# Patient Record
Sex: Male | Born: 1960 | Race: White | Hispanic: No | Smoking: Never smoker
Health system: Southern US, Community
[De-identification: ages and names within clinical notes are randomized; demographics above are authoritative.]

## PROBLEM LIST (undated history)

## (undated) DIAGNOSIS — S42009A Fracture of unspecified part of unspecified clavicle, initial encounter for closed fracture: Secondary | ICD-10-CM

## (undated) DIAGNOSIS — S2239XA Fracture of one rib, unspecified side, initial encounter for closed fracture: Secondary | ICD-10-CM

## (undated) DIAGNOSIS — R0603 Acute respiratory distress: Secondary | ICD-10-CM

## (undated) DIAGNOSIS — J811 Chronic pulmonary edema: Secondary | ICD-10-CM

## (undated) DIAGNOSIS — T7840XA Allergy, unspecified, initial encounter: Secondary | ICD-10-CM

## (undated) DIAGNOSIS — M1990 Unspecified osteoarthritis, unspecified site: Secondary | ICD-10-CM

## (undated) DIAGNOSIS — S2500XA Unspecified injury of thoracic aorta, initial encounter: Secondary | ICD-10-CM

## (undated) DIAGNOSIS — J90 Pleural effusion, not elsewhere classified: Secondary | ICD-10-CM

## (undated) HISTORY — PX: COLONOSCOPY: SHX174

## (undated) HISTORY — DX: Allergy, unspecified, initial encounter: T78.40XA

## (undated) HISTORY — PX: FRACTURE SURGERY: SHX138

## (undated) HISTORY — DX: Unspecified osteoarthritis, unspecified site: M19.90

---
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
## 2015-10-30 ENCOUNTER — Emergency Department (HOSPITAL_COMMUNITY)
Admission: EM | Admit: 2015-10-30 | Discharge: 2015-10-30 | Disposition: A | Discharge status: Home / Self Care | Payer: BC Managed Care – PPO | Attending: Emergency Medicine | Admitting: Emergency Medicine

## 2015-10-30 ENCOUNTER — Encounter (HOSPITAL_COMMUNITY): Payer: Self-pay | Admitting: *Deleted

## 2015-10-30 DIAGNOSIS — Y998 Other external cause status: Secondary | ICD-10-CM | POA: Diagnosis not present

## 2015-10-30 DIAGNOSIS — T7840XA Allergy, unspecified, initial encounter: Secondary | ICD-10-CM | POA: Diagnosis not present

## 2015-10-30 DIAGNOSIS — Y9389 Activity, other specified: Secondary | ICD-10-CM | POA: Insufficient documentation

## 2015-10-30 DIAGNOSIS — X58XXXA Exposure to other specified factors, initial encounter: Secondary | ICD-10-CM | POA: Diagnosis not present

## 2015-10-30 DIAGNOSIS — Y9289 Other specified places as the place of occurrence of the external cause: Secondary | ICD-10-CM | POA: Diagnosis not present

## 2015-10-30 NOTE — ED Notes (Signed)
See note

## 2015-10-30 NOTE — ED Notes (Signed)
The pt is c/o a swollen upper lip since he ate spicy chicken sandwich 1900  No diff breathing  He took a benadryl 25 mg approx one hour ago. He took  benadryl one hour ago

## 2015-10-30 NOTE — ED Notes (Signed)
The pt still has swelling in his upper lip  It is not getting any more swollen  And he has no breathing difficulty.

## 2015-10-30 NOTE — ED Notes (Signed)
The pts lip  Is sl smaller  And he feels like he is not having any more problems  He will leave  Instructed to take another benadryl when he gets home  Sleep with huis head elevated  And returen if it gets worse or difficulty breathing   Then he needs to come back

## 2019-02-17 ENCOUNTER — Emergency Department (HOSPITAL_COMMUNITY): Payer: BC Managed Care – PPO

## 2019-02-17 ENCOUNTER — Encounter (HOSPITAL_COMMUNITY): Payer: Self-pay | Admitting: Emergency Medicine

## 2019-02-17 ENCOUNTER — Other Ambulatory Visit: Payer: Self-pay

## 2019-02-17 ENCOUNTER — Inpatient Hospital Stay (HOSPITAL_COMMUNITY)
Admission: EM | Admit: 2019-02-17 | Discharge: 2019-02-25 | DRG: 907 | Disposition: A | Discharge status: Home Health | Payer: BC Managed Care – PPO | Attending: General Surgery | Admitting: General Surgery

## 2019-02-17 DIAGNOSIS — N179 Acute kidney failure, unspecified: Secondary | ICD-10-CM | POA: Diagnosis present

## 2019-02-17 DIAGNOSIS — F419 Anxiety disorder, unspecified: Secondary | ICD-10-CM | POA: Diagnosis present

## 2019-02-17 DIAGNOSIS — S2243XA Multiple fractures of ribs, bilateral, initial encounter for closed fracture: Secondary | ICD-10-CM | POA: Diagnosis present

## 2019-02-17 DIAGNOSIS — S42001A Fracture of unspecified part of right clavicle, initial encounter for closed fracture: Secondary | ICD-10-CM | POA: Diagnosis present

## 2019-02-17 DIAGNOSIS — J811 Chronic pulmonary edema: Secondary | ICD-10-CM

## 2019-02-17 DIAGNOSIS — M549 Dorsalgia, unspecified: Secondary | ICD-10-CM | POA: Diagnosis present

## 2019-02-17 DIAGNOSIS — R609 Edema, unspecified: Secondary | ICD-10-CM | POA: Diagnosis present

## 2019-02-17 DIAGNOSIS — Y906 Blood alcohol level of 120-199 mg/100 ml: Secondary | ICD-10-CM | POA: Diagnosis present

## 2019-02-17 DIAGNOSIS — S2502XA Major laceration of thoracic aorta, initial encounter: Secondary | ICD-10-CM | POA: Diagnosis not present

## 2019-02-17 DIAGNOSIS — E876 Hypokalemia: Secondary | ICD-10-CM | POA: Diagnosis present

## 2019-02-17 DIAGNOSIS — I10 Essential (primary) hypertension: Secondary | ICD-10-CM | POA: Diagnosis present

## 2019-02-17 DIAGNOSIS — R0603 Acute respiratory distress: Secondary | ICD-10-CM

## 2019-02-17 DIAGNOSIS — D62 Acute posthemorrhagic anemia: Secondary | ICD-10-CM | POA: Diagnosis present

## 2019-02-17 DIAGNOSIS — J9690 Respiratory failure, unspecified, unspecified whether with hypoxia or hypercapnia: Secondary | ICD-10-CM

## 2019-02-17 DIAGNOSIS — K567 Ileus, unspecified: Secondary | ICD-10-CM | POA: Diagnosis present

## 2019-02-17 DIAGNOSIS — S2500XA Unspecified injury of thoracic aorta, initial encounter: Secondary | ICD-10-CM | POA: Diagnosis not present

## 2019-02-17 DIAGNOSIS — J939 Pneumothorax, unspecified: Secondary | ICD-10-CM

## 2019-02-17 DIAGNOSIS — S2249XA Multiple fractures of ribs, unspecified side, initial encounter for closed fracture: Secondary | ICD-10-CM

## 2019-02-17 DIAGNOSIS — R000 Tachycardia, unspecified: Secondary | ICD-10-CM | POA: Diagnosis present

## 2019-02-17 DIAGNOSIS — S2509XA Other specified injury of thoracic aorta, initial encounter: Secondary | ICD-10-CM

## 2019-02-17 DIAGNOSIS — S42009A Fracture of unspecified part of unspecified clavicle, initial encounter for closed fracture: Secondary | ICD-10-CM

## 2019-02-17 DIAGNOSIS — D72829 Elevated white blood cell count, unspecified: Secondary | ICD-10-CM | POA: Diagnosis present

## 2019-02-17 DIAGNOSIS — J9601 Acute respiratory failure with hypoxia: Secondary | ICD-10-CM | POA: Diagnosis present

## 2019-02-17 DIAGNOSIS — S270XXA Traumatic pneumothorax, initial encounter: Secondary | ICD-10-CM | POA: Diagnosis present

## 2019-02-17 DIAGNOSIS — Z1159 Encounter for screening for other viral diseases: Secondary | ICD-10-CM

## 2019-02-17 DIAGNOSIS — F10229 Alcohol dependence with intoxication, unspecified: Secondary | ICD-10-CM | POA: Diagnosis present

## 2019-02-17 LAB — CBC
HCT: 40.8 % (ref 39.0–52.0)
Hemoglobin: 14.2 g/dL (ref 13.0–17.0)
MCH: 33.6 pg (ref 26.0–34.0)
MCHC: 34.8 g/dL (ref 30.0–36.0)
MCV: 96.5 fL (ref 80.0–100.0)
Platelets: 255 10*3/uL (ref 150–400)
RBC: 4.23 MIL/uL (ref 4.22–5.81)
RDW: 12.7 % (ref 11.5–15.5)
WBC: 6.3 10*3/uL (ref 4.0–10.5)
nRBC: 0 % (ref 0.0–0.2)

## 2019-02-17 LAB — COMPREHENSIVE METABOLIC PANEL
ALT: 152 U/L — ABNORMAL HIGH (ref 0–44)
AST: 157 U/L — ABNORMAL HIGH (ref 15–41)
Albumin: 3.9 g/dL (ref 3.5–5.0)
Alkaline Phosphatase: 59 U/L (ref 38–126)
Anion gap: 13 (ref 5–15)
BUN: 8 mg/dL (ref 6–20)
CO2: 21 mmol/L — ABNORMAL LOW (ref 22–32)
Calcium: 8.6 mg/dL — ABNORMAL LOW (ref 8.9–10.3)
Chloride: 104 mmol/L (ref 98–111)
Creatinine, Ser: 1.16 mg/dL (ref 0.61–1.24)
GFR calc Af Amer: >60 mL/min (ref >60)
GFR calc non Af Amer: >60 mL/min (ref >60)
Glucose, Bld: 148 mg/dL — ABNORMAL HIGH (ref 70–99)
Potassium: 3.2 mmol/L — ABNORMAL LOW (ref 3.5–5.1)
Sodium: 138 mmol/L (ref 135–145)
Total Bilirubin: 0.8 mg/dL (ref 0.3–1.2)
Total Protein: 6.8 g/dL (ref 6.5–8.1)

## 2019-02-17 LAB — I-STAT CHEM 8, ED
BUN: 9 mg/dL (ref 6–20)
Calcium, Ion: 1.01 mmol/L — ABNORMAL LOW (ref 1.15–1.40)
Chloride: 103 mmol/L (ref 98–111)
Creatinine, Ser: 1.4 mg/dL — ABNORMAL HIGH (ref 0.61–1.24)
Glucose, Bld: 145 mg/dL — ABNORMAL HIGH (ref 70–99)
HCT: 41 % (ref 39.0–52.0)
Hemoglobin: 13.9 g/dL (ref 13.0–17.0)
Potassium: 3.2 mmol/L — ABNORMAL LOW (ref 3.5–5.1)
Sodium: 138 mmol/L (ref 135–145)
TCO2: 19 mmol/L — ABNORMAL LOW (ref 22–32)

## 2019-02-17 LAB — PROTIME-INR
INR: 1.1 (ref 0.8–1.2)
Prothrombin Time: 13.9 seconds (ref 11.4–15.2)

## 2019-02-17 LAB — CK TOTAL AND CKMB (NOT AT ARMC)
CK, MB: 3.6 ng/mL (ref 0.5–5.0)
Relative Index: 1.8 (ref 0.0–2.5)
Total CK: 204 U/L (ref 49–397)

## 2019-02-17 LAB — SAMPLE TO BLOOD BANK

## 2019-02-17 LAB — LACTIC ACID, PLASMA: Lactic Acid, Venous: 4.7 mmol/L (ref 0.5–1.9)

## 2019-02-17 LAB — ETHANOL: Alcohol, Ethyl (B): 183 mg/dL — ABNORMAL HIGH (ref <10)

## 2019-02-17 MED ORDER — SODIUM CHLORIDE 0.9 % IV SOLN
INTRAVENOUS | Status: DC
  Administered 2019-02-18: via INTRAVENOUS

## 2019-02-17 MED ORDER — IOHEXOL 300 MG/ML  SOLN
100.0000 mL | Freq: Once | INTRAMUSCULAR | Status: AC | PRN
  Administered 2019-02-17: 100 mL via INTRAVENOUS

## 2019-02-17 MED ORDER — ONDANSETRON HCL 4 MG/2ML IJ SOLN
4.0000 mg | Freq: Once | INTRAMUSCULAR | Status: AC
  Administered 2019-02-17: 4 mg via INTRAVENOUS
  Filled 2019-02-17: qty 2

## 2019-02-17 MED ORDER — ESMOLOL HCL-SODIUM CHLORIDE 2000 MG/100ML IV SOLN
25.0000 ug/kg/min | INTRAVENOUS | Status: DC
  Administered 2019-02-17: 25 ug/kg/min via INTRAVENOUS
  Filled 2019-02-17: qty 100

## 2019-02-17 MED ORDER — SODIUM CHLORIDE 0.9 % IV BOLUS
1000.0000 mL | Freq: Once | INTRAVENOUS | Status: AC
  Administered 2019-02-17: 1000 mL via INTRAVENOUS

## 2019-02-17 MED ORDER — FENTANYL CITRATE (PF) 100 MCG/2ML IJ SOLN
50.0000 ug | Freq: Once | INTRAMUSCULAR | Status: AC
  Administered 2019-02-17: 50 ug via INTRAVENOUS
  Filled 2019-02-17: qty 2

## 2019-02-17 MED ORDER — FENTANYL CITRATE (PF) 100 MCG/2ML IJ SOLN
50.0000 ug | Freq: Once | INTRAMUSCULAR | Status: AC
  Administered 2019-02-17: 23:00:00 50 ug via INTRAVENOUS
  Filled 2019-02-17: qty 2
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
## 2019-02-17 NOTE — ED Notes (Signed)
Family updated as to patient's status.Girlfriend Marcelino Duster in consult room.

## 2019-02-17 NOTE — ED Notes (Signed)
Pt continues to cross arms on abdomen and hold tightly, pt reminded repeatedly to keep arms at his sides.
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
## 2019-02-17 NOTE — ED Notes (Signed)
Pt remedicated for pain, [facility] at bedside for legal blood draw
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
## 2019-02-17 NOTE — ED Notes (Signed)
Dr. Dwain Sarna at bedside, ccollar removed

## 2019-02-17 NOTE — ED Notes (Signed)
Ok per pt to give wallet with 22.00, pocket knife, keys and ring along with clothing and boots to his girlfriend.

## 2019-02-17 NOTE — ED Notes (Signed)
Pt returned from CT on CCM with RN. Pt continues to scream out in pain, c/o "spasms" between shoulder blades.

## 2019-02-17 NOTE — ED Notes (Signed)
Advised to hold esmolol to see if Fentanyl helped HR and BP come down.

## 2019-02-17 NOTE — ED Notes (Signed)
Dr. Criss Alvine to speak to family in consult.

## 2019-02-17 NOTE — ED Notes (Signed)
[facility] at bedside 

## 2019-02-18 ENCOUNTER — Inpatient Hospital Stay (HOSPITAL_COMMUNITY): Payer: BC Managed Care – PPO | Admitting: Anesthesiology

## 2019-02-18 ENCOUNTER — Other Ambulatory Visit (HOSPITAL_COMMUNITY): Payer: Self-pay | Admitting: *Deleted

## 2019-02-18 ENCOUNTER — Inpatient Hospital Stay (HOSPITAL_COMMUNITY): Payer: BC Managed Care – PPO

## 2019-02-18 ENCOUNTER — Encounter (HOSPITAL_COMMUNITY): Admission: EM | Disposition: A | Discharge status: Home Health | Payer: Self-pay | Source: Home / Self Care

## 2019-02-18 ENCOUNTER — Encounter (HOSPITAL_COMMUNITY): Payer: Self-pay | Admitting: Anesthesiology

## 2019-02-18 DIAGNOSIS — S2243XA Multiple fractures of ribs, bilateral, initial encounter for closed fracture: Secondary | ICD-10-CM | POA: Diagnosis present

## 2019-02-18 DIAGNOSIS — Z1159 Encounter for screening for other viral diseases: Secondary | ICD-10-CM | POA: Diagnosis not present

## 2019-02-18 DIAGNOSIS — S2500XA Unspecified injury of thoracic aorta, initial encounter: Secondary | ICD-10-CM | POA: Diagnosis present

## 2019-02-18 DIAGNOSIS — F419 Anxiety disorder, unspecified: Secondary | ICD-10-CM | POA: Diagnosis present

## 2019-02-18 DIAGNOSIS — J9601 Acute respiratory failure with hypoxia: Secondary | ICD-10-CM | POA: Diagnosis present

## 2019-02-18 DIAGNOSIS — D62 Acute posthemorrhagic anemia: Secondary | ICD-10-CM | POA: Diagnosis present

## 2019-02-18 DIAGNOSIS — S42001A Fracture of unspecified part of right clavicle, initial encounter for closed fracture: Secondary | ICD-10-CM | POA: Diagnosis present

## 2019-02-18 DIAGNOSIS — D72829 Elevated white blood cell count, unspecified: Secondary | ICD-10-CM | POA: Diagnosis present

## 2019-02-18 DIAGNOSIS — S2509XA Other specified injury of thoracic aorta, initial encounter: Secondary | ICD-10-CM

## 2019-02-18 DIAGNOSIS — S42009A Fracture of unspecified part of unspecified clavicle, initial encounter for closed fracture: Secondary | ICD-10-CM

## 2019-02-18 DIAGNOSIS — R0603 Acute respiratory distress: Secondary | ICD-10-CM

## 2019-02-18 DIAGNOSIS — I10 Essential (primary) hypertension: Secondary | ICD-10-CM | POA: Diagnosis present

## 2019-02-18 DIAGNOSIS — Y906 Blood alcohol level of 120-199 mg/100 ml: Secondary | ICD-10-CM | POA: Diagnosis present

## 2019-02-18 DIAGNOSIS — M549 Dorsalgia, unspecified: Secondary | ICD-10-CM | POA: Diagnosis present

## 2019-02-18 DIAGNOSIS — N179 Acute kidney failure, unspecified: Secondary | ICD-10-CM | POA: Diagnosis present

## 2019-02-18 DIAGNOSIS — K567 Ileus, unspecified: Secondary | ICD-10-CM | POA: Diagnosis present

## 2019-02-18 DIAGNOSIS — J811 Chronic pulmonary edema: Secondary | ICD-10-CM

## 2019-02-18 DIAGNOSIS — S270XXA Traumatic pneumothorax, initial encounter: Secondary | ICD-10-CM | POA: Diagnosis present

## 2019-02-18 DIAGNOSIS — E876 Hypokalemia: Secondary | ICD-10-CM | POA: Diagnosis present

## 2019-02-18 DIAGNOSIS — J90 Pleural effusion, not elsewhere classified: Secondary | ICD-10-CM

## 2019-02-18 DIAGNOSIS — F10229 Alcohol dependence with intoxication, unspecified: Secondary | ICD-10-CM | POA: Diagnosis present

## 2019-02-18 DIAGNOSIS — S2502XA Major laceration of thoracic aorta, initial encounter: Secondary | ICD-10-CM | POA: Diagnosis present

## 2019-02-18 DIAGNOSIS — S2249XA Multiple fractures of ribs, unspecified side, initial encounter for closed fracture: Secondary | ICD-10-CM

## 2019-02-18 DIAGNOSIS — R000 Tachycardia, unspecified: Secondary | ICD-10-CM | POA: Diagnosis present

## 2019-02-18 DIAGNOSIS — R609 Edema, unspecified: Secondary | ICD-10-CM | POA: Diagnosis present

## 2019-02-18 HISTORY — PX: THORACIC AORTIC ENDOVASCULAR STENT GRAFT: SHX6112

## 2019-02-18 HISTORY — DX: Acute respiratory distress: R06.03

## 2019-02-18 HISTORY — DX: Fracture of unspecified part of unspecified clavicle, initial encounter for closed fracture: S42.009A

## 2019-02-18 HISTORY — DX: Rider (driver) (passenger) of other motorcycle injured in unspecified traffic accident, initial encounter: V29.99XA

## 2019-02-18 HISTORY — DX: Pleural effusion, not elsewhere classified: J90

## 2019-02-18 HISTORY — DX: Multiple fractures of ribs, unspecified side, initial encounter for closed fracture: S22.49XA

## 2019-02-18 HISTORY — DX: Chronic pulmonary edema: J81.1

## 2019-02-18 HISTORY — DX: Unspecified injury of thoracic aorta, initial encounter: S25.00XA

## 2019-02-18 LAB — POCT I-STAT 7, (LYTES, BLD GAS, ICA,H+H)
Acid-base deficit: 9 mmol/L — ABNORMAL HIGH (ref 0.0–2.0)
Acid-base deficit: 13 mmol/L — ABNORMAL HIGH (ref 0.0–2.0)
Acid-base deficit: 5 mmol/L — ABNORMAL HIGH (ref 0.0–2.0)
Bicarbonate: 19.3 mmol/L — ABNORMAL LOW (ref 20.0–28.0)
Bicarbonate: 14 mmol/L — ABNORMAL LOW (ref 20.0–28.0)
Bicarbonate: 20.2 mmol/L (ref 20.0–28.0)
Calcium, Ion: 1.11 mmol/L — ABNORMAL LOW (ref 1.15–1.40)
Calcium, Ion: 1.02 mmol/L — ABNORMAL LOW (ref 1.15–1.40)
Calcium, Ion: 1.06 mmol/L — ABNORMAL LOW (ref 1.15–1.40)
HCT: 31 % — ABNORMAL LOW (ref 39.0–52.0)
HCT: 25 % — ABNORMAL LOW (ref 39.0–52.0)
HCT: 30 % — ABNORMAL LOW (ref 39.0–52.0)
Hemoglobin: 10.5 g/dL — ABNORMAL LOW (ref 13.0–17.0)
Hemoglobin: 8.5 g/dL — ABNORMAL LOW (ref 13.0–17.0)
Hemoglobin: 10.2 g/dL — ABNORMAL LOW (ref 13.0–17.0)
O2 Saturation: 98 %
O2 Saturation: 100 %
O2 Saturation: 99 %
Patient temperature: 97.5
Potassium: 4 mmol/L (ref 3.5–5.1)
Potassium: 2.9 mmol/L — ABNORMAL LOW (ref 3.5–5.1)
Potassium: 3.9 mmol/L (ref 3.5–5.1)
Sodium: 139 mmol/L (ref 135–145)
Sodium: 143 mmol/L (ref 135–145)
Sodium: 140 mmol/L (ref 135–145)
TCO2: 21 mmol/L — ABNORMAL LOW (ref 22–32)
TCO2: 15 mmol/L — ABNORMAL LOW (ref 22–32)
TCO2: 21 mmol/L — ABNORMAL LOW (ref 22–32)
pCO2 arterial: 49.6 mmHg — ABNORMAL HIGH (ref 32.0–48.0)
pCO2 arterial: 36.2 mmHg (ref 32.0–48.0)
pCO2 arterial: 36.9 mmHg (ref 32.0–48.0)
pH, Arterial: 7.197 — CL (ref 7.350–7.450)
pH, Arterial: 7.191 — CL (ref 7.350–7.450)
pH, Arterial: 7.345 — ABNORMAL LOW (ref 7.350–7.450)
pO2, Arterial: 120 mmHg — ABNORMAL HIGH (ref 83.0–108.0)
pO2, Arterial: 278 mmHg — ABNORMAL HIGH (ref 83.0–108.0)
pO2, Arterial: 131 mmHg — ABNORMAL HIGH (ref 83.0–108.0)

## 2019-02-18 LAB — BASIC METABOLIC PANEL
Anion gap: 12 (ref 5–15)
Anion gap: 13 (ref 5–15)
BUN: 8 mg/dL (ref 6–20)
BUN: 9 mg/dL (ref 6–20)
CO2: 17 mmol/L — ABNORMAL LOW (ref 22–32)
CO2: 20 mmol/L — ABNORMAL LOW (ref 22–32)
Calcium: 7.3 mg/dL — ABNORMAL LOW (ref 8.9–10.3)
Calcium: 7.8 mg/dL — ABNORMAL LOW (ref 8.9–10.3)
Chloride: 109 mmol/L (ref 98–111)
Chloride: 103 mmol/L (ref 98–111)
Creatinine, Ser: 0.97 mg/dL (ref 0.61–1.24)
Creatinine, Ser: 0.97 mg/dL (ref 0.61–1.24)
GFR calc Af Amer: >60 mL/min (ref >60)
GFR calc Af Amer: >60 mL/min (ref >60)
GFR calc non Af Amer: >60 mL/min (ref >60)
GFR calc non Af Amer: >60 mL/min (ref >60)
Glucose, Bld: 136 mg/dL — ABNORMAL HIGH (ref 70–99)
Glucose, Bld: 134 mg/dL — ABNORMAL HIGH (ref 70–99)
Potassium: 4 mmol/L (ref 3.5–5.1)
Potassium: 3.9 mmol/L (ref 3.5–5.1)
Sodium: 138 mmol/L (ref 135–145)
Sodium: 136 mmol/L (ref 135–145)

## 2019-02-18 LAB — POCT ACTIVATED CLOTTING TIME: Activated Clotting Time: 208 s

## 2019-02-18 LAB — POCT I-STAT, CHEM 8
BUN: 7 mg/dL (ref 6–20)
Calcium, Ion: 1.04 mmol/L — ABNORMAL LOW (ref 1.15–1.40)
Chloride: 109 mmol/L (ref 98–111)
Creatinine, Ser: 0.8 mg/dL (ref 0.61–1.24)
Glucose, Bld: 127 mg/dL — ABNORMAL HIGH (ref 70–99)
HCT: 29 % — ABNORMAL LOW (ref 39.0–52.0)
Hemoglobin: 9.9 g/dL — ABNORMAL LOW (ref 13.0–17.0)
Potassium: 3.6 mmol/L (ref 3.5–5.1)
Sodium: 142 mmol/L (ref 135–145)
TCO2: 19 mmol/L — ABNORMAL LOW (ref 22–32)

## 2019-02-18 LAB — URINALYSIS, ROUTINE W REFLEX MICROSCOPIC
Bilirubin Urine: NEGATIVE
Glucose, UA: NEGATIVE mg/dL
Ketones, ur: 5 mg/dL — AB
Leukocytes,Ua: NEGATIVE
Nitrite: NEGATIVE
Protein, ur: NEGATIVE mg/dL
Specific Gravity, Urine: >1.046 — ABNORMAL HIGH (ref 1.005–1.030)
pH: 5 (ref 5.0–8.0)

## 2019-02-18 LAB — CBC
HCT: 32.2 % — ABNORMAL LOW (ref 39.0–52.0)
HCT: 31.7 % — ABNORMAL LOW (ref 39.0–52.0)
HCT: 29.8 % — ABNORMAL LOW (ref 39.0–52.0)
Hemoglobin: 11.3 g/dL — ABNORMAL LOW (ref 13.0–17.0)
Hemoglobin: 11.1 g/dL — ABNORMAL LOW (ref 13.0–17.0)
Hemoglobin: 10.5 g/dL — ABNORMAL LOW (ref 13.0–17.0)
MCH: 34.1 pg — ABNORMAL HIGH (ref 26.0–34.0)
MCH: 33.8 pg (ref 26.0–34.0)
MCH: 33.4 pg (ref 26.0–34.0)
MCHC: 35.1 g/dL (ref 30.0–36.0)
MCHC: 35 g/dL (ref 30.0–36.0)
MCHC: 35.2 g/dL (ref 30.0–36.0)
MCV: 97.3 fL (ref 80.0–100.0)
MCV: 96.6 fL (ref 80.0–100.0)
MCV: 94.9 fL (ref 80.0–100.0)
Platelets: 197 10*3/uL (ref 150–400)
Platelets: 181 10*3/uL (ref 150–400)
Platelets: 178 10*3/uL (ref 150–400)
RBC: 3.31 MIL/uL — ABNORMAL LOW (ref 4.22–5.81)
RBC: 3.28 MIL/uL — ABNORMAL LOW (ref 4.22–5.81)
RBC: 3.14 MIL/uL — ABNORMAL LOW (ref 4.22–5.81)
RDW: 12.8 % (ref 11.5–15.5)
RDW: 12.8 % (ref 11.5–15.5)
RDW: 13.1 % (ref 11.5–15.5)
WBC: 15.2 10*3/uL — ABNORMAL HIGH (ref 4.0–10.5)
WBC: 11.1 10*3/uL — ABNORMAL HIGH (ref 4.0–10.5)
WBC: 8.5 10*3/uL (ref 4.0–10.5)
nRBC: 0 % (ref 0.0–0.2)
nRBC: 0 % (ref 0.0–0.2)
nRBC: 0 % (ref 0.0–0.2)

## 2019-02-18 LAB — RAPID URINE DRUG SCREEN, HOSP PERFORMED
Amphetamines: NOT DETECTED
Barbiturates: NOT DETECTED
Benzodiazepines: NOT DETECTED
Cocaine: NOT DETECTED
Opiates: NOT DETECTED
Tetrahydrocannabinol: NOT DETECTED

## 2019-02-18 LAB — LACTIC ACID, PLASMA: Lactic Acid, Venous: 3.3 mmol/L (ref 0.5–1.9)

## 2019-02-18 LAB — TRIGLYCERIDES: Triglycerides: 151 mg/dL — ABNORMAL HIGH (ref <150)

## 2019-02-18 LAB — ABO/RH: ABO/RH(D): O NEG

## 2019-02-18 LAB — PROTIME-INR
INR: 1.3 — ABNORMAL HIGH (ref 0.8–1.2)
Prothrombin Time: 15.7 seconds — ABNORMAL HIGH (ref 11.4–15.2)

## 2019-02-18 LAB — MAGNESIUM: Magnesium: 1.5 mg/dL — ABNORMAL LOW (ref 1.7–2.4)

## 2019-02-18 LAB — PREPARE RBC (CROSSMATCH)

## 2019-02-18 LAB — SARS CORONAVIRUS 2 BY RT PCR (HOSPITAL ORDER, PERFORMED IN ~~LOC~~ HOSPITAL LAB): SARS Coronavirus 2: NEGATIVE

## 2019-02-18 LAB — CDS SEROLOGY

## 2019-02-18 LAB — MRSA PCR SCREENING: MRSA by PCR: NEGATIVE

## 2019-02-18 LAB — APTT: aPTT: 45 seconds — ABNORMAL HIGH (ref 24–36)

## 2019-02-18 SURGERY — INSERTION, ENDOVASCULAR STENT GRAFT, AORTA, THORACIC
Anesthesia: General

## 2019-02-18 MED ORDER — HYDROMORPHONE HCL 1 MG/ML IJ SOLN
INTRAMUSCULAR | Status: AC
  Administered 2019-02-18: 1 mg via INTRAVENOUS
  Filled 2019-02-18: qty 1

## 2019-02-18 MED ORDER — FENTANYL CITRATE (PF) 100 MCG/2ML IJ SOLN: 50.0000 ug | Freq: Once | INTRAMUSCULAR | Status: DC

## 2019-02-18 MED ORDER — ARTIFICIAL TEARS OPHTHALMIC OINT
TOPICAL_OINTMENT | OPHTHALMIC | Status: DC | PRN
  Administered 2019-02-18: 1 via OPHTHALMIC

## 2019-02-18 MED ORDER — PHENOL 1.4 % MT LIQD: 1.0000 | OROMUCOSAL | Status: DC | PRN

## 2019-02-18 MED ORDER — LACTATED RINGERS IV SOLN
INTRAVENOUS | Status: DC | PRN
  Administered 2019-02-18: 02:00:00 via INTRAVENOUS

## 2019-02-18 MED ORDER — LIDOCAINE 2% (20 MG/ML) 5 ML SYRINGE
INTRAMUSCULAR | Status: AC
  Filled 2019-02-18: qty 5

## 2019-02-18 MED ORDER — FENTANYL CITRATE (PF) 100 MCG/2ML IJ SOLN
INTRAMUSCULAR | Status: DC | PRN
  Administered 2019-02-18 (×2): 50 ug via INTRAVENOUS
  Administered 2019-02-18: 150 ug via INTRAVENOUS

## 2019-02-18 MED ORDER — PROPOFOL 10 MG/ML IV BOLUS
INTRAVENOUS | Status: DC | PRN
  Administered 2019-02-18: 50 mg via INTRAVENOUS

## 2019-02-18 MED ORDER — SUCCINYLCHOLINE CHLORIDE 200 MG/10ML IV SOSY
PREFILLED_SYRINGE | INTRAVENOUS | Status: AC
  Filled 2019-02-18: qty 10

## 2019-02-18 MED ORDER — SODIUM CHLORIDE 0.9 % IV SOLN
INTRAVENOUS | Status: DC | PRN
  Administered 2019-02-18: 500 mL

## 2019-02-18 MED ORDER — SODIUM CHLORIDE 0.9 % IV SOLN
INTRAVENOUS | Status: AC
  Filled 2019-02-18: qty 1.2

## 2019-02-18 MED ORDER — EPHEDRINE 5 MG/ML INJ
INTRAVENOUS | Status: AC
  Filled 2019-02-18: qty 10

## 2019-02-18 MED ORDER — MAGNESIUM SULFATE 2 GM/50ML IV SOLN
2.0000 g | Freq: Every day | INTRAVENOUS | Status: AC | PRN
  Administered 2019-02-18: 2 g via INTRAVENOUS
  Filled 2019-02-18: qty 50

## 2019-02-18 MED ORDER — ROCURONIUM BROMIDE 10 MG/ML (PF) SYRINGE
PREFILLED_SYRINGE | INTRAVENOUS | Status: AC
  Filled 2019-02-18: qty 10

## 2019-02-18 MED ORDER — MORPHINE SULFATE (PF) 2 MG/ML IV SOLN: 2.0000 mg | INTRAVENOUS | Status: DC | PRN

## 2019-02-18 MED ORDER — CEFAZOLIN SODIUM-DEXTROSE 2-4 GM/100ML-% IV SOLN
2.0000 g | Freq: Once | INTRAVENOUS | Status: AC
  Administered 2019-02-18: 2 g via INTRAVENOUS
  Filled 2019-02-18: qty 100

## 2019-02-18 MED ORDER — SODIUM CHLORIDE 0.9 % IV SOLN
INTRAVENOUS | Status: DC | PRN
  Administered 2019-02-18: 02:00:00 50 ug/min via INTRAVENOUS

## 2019-02-18 MED ORDER — ONDANSETRON 4 MG PO TBDP: 4.0000 mg | ORAL_TABLET | Freq: Four times a day (QID) | ORAL | Status: DC | PRN

## 2019-02-18 MED ORDER — MIDAZOLAM HCL 2 MG/2ML IJ SOLN
INTRAMUSCULAR | Status: AC
  Filled 2019-02-18: qty 2

## 2019-02-18 MED ORDER — FENTANYL CITRATE (PF) 100 MCG/2ML IJ SOLN
INTRAMUSCULAR | Status: AC
  Filled 2019-02-18: qty 2

## 2019-02-18 MED ORDER — GUAIFENESIN-DM 100-10 MG/5ML PO SYRP: 15.0000 mL | ORAL_SOLUTION | ORAL | Status: DC | PRN

## 2019-02-18 MED ORDER — CHLORHEXIDINE GLUCONATE 0.12% ORAL RINSE (MEDLINE KIT)
15.0000 mL | Freq: Two times a day (BID) | OROMUCOSAL | Status: DC
  Administered 2019-02-18 – 2019-02-25 (×11): 15 mL via OROMUCOSAL

## 2019-02-18 MED ORDER — PROTAMINE SULFATE 10 MG/ML IV SOLN
INTRAVENOUS | Status: DC | PRN
  Administered 2019-02-18 (×5): 10 mg via INTRAVENOUS

## 2019-02-18 MED ORDER — DOCUSATE SODIUM 100 MG PO CAPS: 100.0000 mg | ORAL_CAPSULE | Freq: Every day | ORAL | Status: DC

## 2019-02-18 MED ORDER — METOPROLOL TARTRATE 5 MG/5ML IV SOLN: 2.0000 mg | INTRAVENOUS | Status: DC | PRN

## 2019-02-18 MED ORDER — FENTANYL 2500MCG IN NS 250ML (10MCG/ML) PREMIX INFUSION
50.0000 ug/h | INTRAVENOUS | Status: DC
  Administered 2019-02-18: 100 ug/h via INTRAVENOUS
  Administered 2019-02-18: 150 ug/h via INTRAVENOUS
  Administered 2019-02-18: 50 ug/h via INTRAVENOUS
  Filled 2019-02-18 (×2): qty 250

## 2019-02-18 MED ORDER — METHOCARBAMOL 1000 MG/10ML IJ SOLN
500.0000 mg | Freq: Three times a day (TID) | INTRAVENOUS | Status: DC
  Administered 2019-02-18 – 2019-02-22 (×13): 500 mg via INTRAVENOUS
  Filled 2019-02-18 (×2): qty 500
  Filled 2019-02-18 (×15): qty 5

## 2019-02-18 MED ORDER — EPHEDRINE SULFATE 50 MG/ML IJ SOLN
INTRAMUSCULAR | Status: DC | PRN
  Administered 2019-02-18: 10 mg via INTRAVENOUS

## 2019-02-18 MED ORDER — LORAZEPAM 2 MG/ML IJ SOLN
1.0000 mg | Freq: Four times a day (QID) | INTRAMUSCULAR | Status: DC | PRN
  Administered 2019-02-20 (×2): 1 mg via INTRAVENOUS
  Filled 2019-02-18 (×2): qty 1

## 2019-02-18 MED ORDER — ORAL CARE MOUTH RINSE
15.0000 mL | OROMUCOSAL | Status: DC
  Administered 2019-02-18 – 2019-02-19 (×12): 15 mL via OROMUCOSAL

## 2019-02-18 MED ORDER — CEFAZOLIN SODIUM-DEXTROSE 2-4 GM/100ML-% IV SOLN
2.0000 g | Freq: Three times a day (TID) | INTRAVENOUS | Status: AC
  Administered 2019-02-18 (×2): 2 g via INTRAVENOUS
  Filled 2019-02-18 (×2): qty 100

## 2019-02-18 MED ORDER — HYDROMORPHONE HCL 1 MG/ML IJ SOLN
1.0000 mg | INTRAMUSCULAR | Status: DC | PRN
  Administered 2019-02-18: 1 mg via INTRAVENOUS
  Filled 2019-02-18: qty 1

## 2019-02-18 MED ORDER — PHENYLEPHRINE 40 MCG/ML (10ML) SYRINGE FOR IV PUSH (FOR BLOOD PRESSURE SUPPORT)
PREFILLED_SYRINGE | INTRAVENOUS | Status: AC
  Filled 2019-02-18: qty 20

## 2019-02-18 MED ORDER — FENTANYL CITRATE (PF) 250 MCG/5ML IJ SOLN
INTRAMUSCULAR | Status: AC
  Filled 2019-02-18: qty 5

## 2019-02-18 MED ORDER — HEPARIN SODIUM (PORCINE) 1000 UNIT/ML IJ SOLN
INTRAMUSCULAR | Status: AC
  Filled 2019-02-18: qty 2

## 2019-02-18 MED ORDER — PROTAMINE SULFATE 10 MG/ML IV SOLN
INTRAVENOUS | Status: AC
  Filled 2019-02-18: qty 5

## 2019-02-18 MED ORDER — SODIUM CHLORIDE 0.9 % IV SOLN: INTRAVENOUS | Status: DC | PRN

## 2019-02-18 MED ORDER — PANTOPRAZOLE SODIUM 40 MG PO TBEC: 40.0000 mg | DELAYED_RELEASE_TABLET | Freq: Every day | ORAL | Status: DC

## 2019-02-18 MED ORDER — PHENYLEPHRINE HCL (PRESSORS) 10 MG/ML IV SOLN
INTRAVENOUS | Status: DC | PRN
  Administered 2019-02-18 (×3): 80 ug via INTRAVENOUS

## 2019-02-18 MED ORDER — ONDANSETRON HCL 4 MG/2ML IJ SOLN: 4.0000 mg | Freq: Four times a day (QID) | INTRAMUSCULAR | Status: DC | PRN

## 2019-02-18 MED ORDER — FOLIC ACID 1 MG PO TABS
1.0000 mg | ORAL_TABLET | Freq: Every day | ORAL | Status: DC
  Administered 2019-02-19 – 2019-02-25 (×6): 1 mg via ORAL
  Filled 2019-02-18 (×6): qty 1

## 2019-02-18 MED ORDER — BISACODYL 10 MG RE SUPP: 10.0000 mg | Freq: Every day | RECTAL | Status: DC | PRN

## 2019-02-18 MED ORDER — ARTIFICIAL TEARS OPHTHALMIC OINT
TOPICAL_OINTMENT | OPHTHALMIC | Status: AC
  Filled 2019-02-18: qty 3.5

## 2019-02-18 MED ORDER — POTASSIUM CHLORIDE 10 MEQ/100ML IV SOLN: 10.0000 meq | INTRAVENOUS | Status: DC

## 2019-02-18 MED ORDER — ALBUMIN HUMAN 5 % IV SOLN
INTRAVENOUS | Status: DC | PRN
  Administered 2019-02-18 (×5): via INTRAVENOUS

## 2019-02-18 MED ORDER — VITAMIN B-1 100 MG PO TABS
100.0000 mg | ORAL_TABLET | Freq: Every day | ORAL | Status: DC
  Administered 2019-02-20 – 2019-02-25 (×4): 100 mg via ORAL
  Filled 2019-02-18 (×4): qty 1

## 2019-02-18 MED ORDER — PROPOFOL 10 MG/ML IV BOLUS
INTRAVENOUS | Status: AC
  Filled 2019-02-18: qty 20

## 2019-02-18 MED ORDER — ACETAMINOPHEN 325 MG RE SUPP: 325.0000 mg | RECTAL | Status: DC | PRN

## 2019-02-18 MED ORDER — IODIXANOL 320 MG/ML IV SOLN
INTRAVENOUS | Status: DC | PRN
  Administered 2019-02-18: 40.1 mL via INTRA_ARTERIAL

## 2019-02-18 MED ORDER — POTASSIUM CHLORIDE CRYS ER 20 MEQ PO TBCR: 20.0000 meq | EXTENDED_RELEASE_TABLET | Freq: Every day | ORAL | Status: DC | PRN

## 2019-02-18 MED ORDER — ACETAMINOPHEN 10 MG/ML IV SOLN
1000.0000 mg | Freq: Four times a day (QID) | INTRAVENOUS | Status: AC
  Administered 2019-02-18 (×4): 1000 mg via INTRAVENOUS
  Filled 2019-02-18 (×4): qty 100

## 2019-02-18 MED ORDER — ONDANSETRON HCL 4 MG/2ML IJ SOLN
INTRAMUSCULAR | Status: DC | PRN
  Administered 2019-02-18: 4 mg via INTRAVENOUS

## 2019-02-18 MED ORDER — PHENYLEPHRINE 40 MCG/ML (10ML) SYRINGE FOR IV PUSH (FOR BLOOD PRESSURE SUPPORT)
PREFILLED_SYRINGE | INTRAVENOUS | Status: AC
  Filled 2019-02-18: qty 10

## 2019-02-18 MED ORDER — CHLORHEXIDINE GLUCONATE CLOTH 2 % EX PADS
6.0000 | MEDICATED_PAD | Freq: Every day | CUTANEOUS | Status: DC
  Administered 2019-02-18 – 2019-02-25 (×8): 6 via TOPICAL

## 2019-02-18 MED ORDER — PROPOFOL 500 MG/50ML IV EMUL
INTRAVENOUS | Status: DC | PRN
  Administered 2019-02-18: 50 ug/kg/min via INTRAVENOUS

## 2019-02-18 MED ORDER — LIDOCAINE 2% (20 MG/ML) 5 ML SYRINGE
INTRAMUSCULAR | Status: DC | PRN
  Administered 2019-02-18: 60 mg via INTRAVENOUS

## 2019-02-18 MED ORDER — ALBUMIN HUMAN 5 % IV SOLN
12.5000 g | Freq: Once | INTRAVENOUS | Status: AC
  Administered 2019-02-18: 12.5 g via INTRAVENOUS
  Filled 2019-02-18: qty 250

## 2019-02-18 MED ORDER — DOCUSATE SODIUM 50 MG/5ML PO LIQD: 100.0000 mg | Freq: Two times a day (BID) | ORAL | Status: DC | PRN

## 2019-02-18 MED ORDER — SUCCINYLCHOLINE CHLORIDE 20 MG/ML IJ SOLN
INTRAMUSCULAR | Status: DC | PRN
  Administered 2019-02-18: 100 mg via INTRAVENOUS

## 2019-02-18 MED ORDER — HEPARIN SODIUM (PORCINE) 1000 UNIT/ML IJ SOLN
INTRAMUSCULAR | Status: DC | PRN
  Administered 2019-02-18: 10000 [IU] via INTRAVENOUS
  Administered 2019-02-18: 3000 [IU] via INTRAVENOUS

## 2019-02-18 MED ORDER — FENTANYL BOLUS VIA INFUSION
50.0000 ug | INTRAVENOUS | Status: DC | PRN
  Administered 2019-02-19 (×2): 50 ug via INTRAVENOUS
  Filled 2019-02-18: qty 50

## 2019-02-18 MED ORDER — THIAMINE HCL 100 MG/ML IJ SOLN
100.0000 mg | Freq: Every day | INTRAMUSCULAR | Status: DC
  Administered 2019-02-18 – 2019-02-22 (×3): 100 mg via INTRAVENOUS
  Filled 2019-02-18 (×3): qty 2

## 2019-02-18 MED ORDER — ACETAMINOPHEN 325 MG PO TABS: 325.0000 mg | ORAL_TABLET | ORAL | Status: DC | PRN

## 2019-02-18 MED ORDER — ROCURONIUM BROMIDE 50 MG/5ML IV SOSY
PREFILLED_SYRINGE | INTRAVENOUS | Status: DC | PRN
  Administered 2019-02-18: 10 mg via INTRAVENOUS
  Administered 2019-02-18: 50 mg via INTRAVENOUS

## 2019-02-18 MED ORDER — ONDANSETRON HCL 4 MG/2ML IJ SOLN
INTRAMUSCULAR | Status: AC
  Filled 2019-02-18: qty 2

## 2019-02-18 MED ORDER — LORAZEPAM 1 MG PO TABS: 1.0000 mg | ORAL_TABLET | Freq: Four times a day (QID) | ORAL | Status: DC | PRN

## 2019-02-18 MED ORDER — SODIUM CHLORIDE 0.9 % IV SOLN
INTRAVENOUS | Status: DC
  Administered 2019-02-18 – 2019-02-21 (×8): via INTRAVENOUS

## 2019-02-18 MED ORDER — LABETALOL HCL 5 MG/ML IV SOLN
10.0000 mg | INTRAVENOUS | Status: AC | PRN
  Administered 2019-02-19 (×4): 10 mg via INTRAVENOUS
  Filled 2019-02-18 (×3): qty 4

## 2019-02-18 MED ORDER — SODIUM CHLORIDE 0.9 % IV SOLN: 500.0000 mL | Freq: Once | INTRAVENOUS | Status: DC | PRN

## 2019-02-18 MED ORDER — ADULT MULTIVITAMIN W/MINERALS CH
1.0000 | ORAL_TABLET | Freq: Every day | ORAL | Status: DC
  Administered 2019-02-19 – 2019-02-25 (×6): 1 via ORAL
  Filled 2019-02-18 (×6): qty 1

## 2019-02-18 MED ORDER — HYDRALAZINE HCL 20 MG/ML IJ SOLN: 5.0000 mg | INTRAMUSCULAR | Status: DC | PRN

## 2019-02-18 MED ORDER — SODIUM BICARBONATE 8.4 % IV SOLN
50.0000 meq | Freq: Once | INTRAVENOUS | Status: AC
  Administered 2019-02-18: 50 meq via INTRAVENOUS
  Filled 2019-02-18: qty 50

## 2019-02-18 MED ORDER — ESMOLOL HCL-SODIUM CHLORIDE 2000 MG/100ML IV SOLN
25.0000 ug/kg/min | INTRAVENOUS | Status: DC
  Administered 2019-02-18: 62.5 ug/kg/min via INTRAVENOUS
  Filled 2019-02-18 (×2): qty 100

## 2019-02-18 MED ORDER — SODIUM CHLORIDE 0.9 % IV SOLN
INTRAVENOUS | Status: DC
  Administered 2019-02-18: 05:00:00 via INTRAVENOUS

## 2019-02-18 MED ORDER — ASPIRIN EC 81 MG PO TBEC
81.0000 mg | DELAYED_RELEASE_TABLET | Freq: Every day | ORAL | Status: DC
  Administered 2019-02-19 – 2019-02-25 (×6): 81 mg via ORAL
  Filled 2019-02-18 (×6): qty 1

## 2019-02-18 MED ORDER — PROPOFOL 1000 MG/100ML IV EMUL
0.0000 ug/kg/min | INTRAVENOUS | Status: DC
  Administered 2019-02-18: 20 ug/kg/min via INTRAVENOUS
  Administered 2019-02-18 (×2): 50 ug/kg/min via INTRAVENOUS
  Filled 2019-02-18 (×3): qty 100

## 2019-02-18 MED ORDER — PANTOPRAZOLE SODIUM 40 MG IV SOLR
40.0000 mg | Freq: Once | INTRAVENOUS | Status: AC
  Administered 2019-02-18: 40 mg via INTRAVENOUS
  Filled 2019-02-18: qty 40

## 2019-02-18 SURGICAL SUPPLY — 53 items
BAG SNAP BAND KOVER 36X36 (MISCELLANEOUS) ×3 IMPLANT
CANISTER SUCT 3000ML PPV (MISCELLANEOUS) ×3 IMPLANT
CATH ACCU-VU SIZ PIG 5F 100CM (CATHETERS) ×3 IMPLANT
CATH BEACON 5 .035 65 KMP TIP (CATHETERS) ×3 IMPLANT
COVER PROBE W GEL 5X96 (DRAPES) ×3 IMPLANT
COVER WAND RF STERILE (DRAPES) ×3 IMPLANT
DERMABOND ADVANCED (GAUZE/BANDAGES/DRESSINGS) ×2
DERMABOND ADVANCED .7 DNX12 (GAUZE/BANDAGES/DRESSINGS) ×1 IMPLANT
DEVICE CLOSURE PERCLS PRGLD 6F (VASCULAR PRODUCTS) ×2 IMPLANT
DRSG TEGADERM 2-3/8X2-3/4 SM (GAUZE/BANDAGES/DRESSINGS) ×3 IMPLANT
DRYSEAL FLEXSHEATH 20FR 33CM (SHEATH) ×3 IMPLANT
ELECT REM PT RETURN 9FT ADLT (ELECTROSURGICAL) ×6
ELECTRODE REM PT RTRN 9FT ADLT (ELECTROSURGICAL) ×2 IMPLANT
ENDOPROSTHESIS THORAC 28X28X10 (Endovascular Graft) ×1 IMPLANT
ENDOPROTH THORACIC 28X28X10 (Endovascular Graft) ×3 IMPLANT
GLOVE BIO SURGEON STRL SZ 6.5 (GLOVE) ×4 IMPLANT
GLOVE BIO SURGEON STRL SZ7.5 (GLOVE) ×3 IMPLANT
GLOVE BIO SURGEONS STRL SZ 6.5 (GLOVE) ×2
GLOVE BIOGEL PI IND STRL 6.5 (GLOVE) ×1 IMPLANT
GLOVE BIOGEL PI IND STRL 7.5 (GLOVE) ×2 IMPLANT
GLOVE BIOGEL PI IND STRL 8 (GLOVE) ×1 IMPLANT
GLOVE BIOGEL PI INDICATOR 6.5 (GLOVE) ×2
GLOVE BIOGEL PI INDICATOR 7.5 (GLOVE) ×4
GLOVE BIOGEL PI INDICATOR 8 (GLOVE) ×2
GLOVE SURG SS PI 7.5 STRL IVOR (GLOVE) ×6 IMPLANT
GOWN STRL REUS W/ TWL LRG LVL3 (GOWN DISPOSABLE) ×3 IMPLANT
GOWN STRL REUS W/ TWL XL LVL3 (GOWN DISPOSABLE) ×1 IMPLANT
GOWN STRL REUS W/TWL LRG LVL3 (GOWN DISPOSABLE) ×6
GOWN STRL REUS W/TWL XL LVL3 (GOWN DISPOSABLE) ×2
KIT BASIN OR (CUSTOM PROCEDURE TRAY) ×3 IMPLANT
KIT TURNOVER KIT B (KITS) ×3 IMPLANT
NEEDLE PERC 18GX7CM (NEEDLE) ×3 IMPLANT
NS IRRIG 1000ML POUR BTL (IV SOLUTION) ×3 IMPLANT
PACK ENDOVASCULAR (PACKS) ×3 IMPLANT
PAD ARMBOARD 7.5X6 YLW CONV (MISCELLANEOUS) ×6 IMPLANT
PERCLOSE PROGLIDE 6F (VASCULAR PRODUCTS) ×6
SET MICROPUNCTURE 5F STIFF (MISCELLANEOUS) ×3 IMPLANT
SHEATH AVANTI 11CM 8FR (SHEATH) IMPLANT
STAPLER VISISTAT 35W (STAPLE) IMPLANT
STOPCOCK MORSE 400PSI 3WAY (MISCELLANEOUS) ×3 IMPLANT
SUT ETHILON 3 0 PS 1 (SUTURE) IMPLANT
SUT MNCRL AB 4-0 PS2 18 (SUTURE) ×3 IMPLANT
SUT PROLENE 5 0 C 1 24 (SUTURE) IMPLANT
SUT VIC AB 2-0 CTX 36 (SUTURE) IMPLANT
SUT VIC AB 3-0 SH 27 (SUTURE)
SUT VIC AB 3-0 SH 27X BRD (SUTURE) IMPLANT
SYR 30ML LL (SYRINGE) ×3 IMPLANT
SYR 50ML LL SCALE MARK (SYRINGE) IMPLANT
TOWEL GREEN STERILE (TOWEL DISPOSABLE) ×3 IMPLANT
TRAY FOLEY MTR SLVR 16FR STAT (SET/KITS/TRAYS/PACK) ×3 IMPLANT
TUBING HIGH PRESSURE 120CM (CONNECTOR) ×3 IMPLANT
WIRE BENTSON .035X145CM (WIRE) ×6 IMPLANT
WIRE STIFF LUNDERQUIST 260CM (WIRE) ×3 IMPLANT
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
## 2019-02-18 NOTE — Anesthesia Procedure Notes (Signed)
Arterial Line Insertion Start/End6/09/2018 2:00 AM, 02/18/2019 2:05 AM Performed by: Edmonia Caprio, CRNA, CRNA  Patient location: OR. Preanesthetic checklist: patient identified, IV checked and monitors and equipment checked Emergency situation radial was placed Catheter size: 20 G Hand hygiene performed  and maximum sterile barriers used  Allen's test indicative of satisfactory collateral circulation Attempts: 1 Procedure performed without using ultrasound guided technique. Following insertion, dressing applied and Biopatch. Post procedure assessment: normal  Patient tolerated the procedure well with no immediate complications.

## 2019-02-18 NOTE — Progress Notes (Signed)
ETT tube advanced by 2cm per MD order. Patient is now at 24 at the lip. Patient tolerated well.

## 2019-02-18 NOTE — ED Notes (Signed)
Dr. Chestine Spore speaking with family

## 2019-02-18 NOTE — Consult Note (Signed)
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 Mediastinum/Nodes: No hilar or mediastinal adenopathy. There is extension of periaortic hematoma into the mediastinum and surrounding the esophagus. Lungs/Pleura: There is a small pneumothorax primarily in the anterior inferior chest. There are bibasilar subsegmental atelectasis and possible scattered areas of mild pulmonary contusions. Probable trace bilateral pleural effusions. The central airways are patent. Musculoskeletal: There is a comminuted fracture of the midportion of the right clavicle without significant  displacement. Multiple displaced right rib fractures noted involving the second-seventh ribs. Several mildly displaced fractures of the anterior left ribs including third and fourth ribs. CT ABDOMEN PELVIS FINDINGS No intra-abdominal free air or free fluid. Hepatobiliary: No focal liver abnormality is  seen. No gallstones, gallbladder wall thickening, or biliary dilatation. Pancreas: Unremarkable. No pancreatic ductal dilatation or surrounding inflammatory changes. Spleen: Normal in size without focal abnormality. Adrenals/Urinary Tract: Minimal amount of slightly high attenuating fluid adjacent to the right adrenal gland, likely a small hematoma. The left adrenal gland is unremarkable. There is no hydronephrosis on either side. Small hypodense lesion in the posterior aspect of the inferior pole of the left kidney, incompletely characterized, likely a cyst. The visualized ureters and urinary bladder appear unremarkable. Stomach/Bowel: There is no bowel obstruction or active inflammation. The appendix is not visualized with certainty. No inflammatory changes identified in the right lower quadrant. Vascular/Lymphatic: Mild aortoiliac atherosclerotic disease. The abdominal aorta appears unremarkable. Small high attenuating fluid posterior to the IVC adjacent to the right adrenal gland likely a small hematoma of indeterminate origin. This may be hemorrhage arising from the right adrenal gland or from the IVC. No extravasation of contrast identified. No portal venous gas. There is no adenopathy. Reproductive: The prostate and seminal vesicles are grossly unremarkable. Other: None Musculoskeletal: No acute or significant osseous findings. IMPRESSION: 1. 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 There is periaortic hematoma extending from the aortic arch to the descending thoracic aorta as well as surrounding the origins of the great vessels of the aortic arch. No definite  extravasation of contrast. Surgical consult is advised. 2. Bilateral rib fractures and small bilateral pneumothoraces. 3. A sliver of hematoma between the right adrenal gland and IVC. This likely represents a traumatic injury to the right adrenal gland, although trauma to the IVC is not excluded. No extravasation of contrast. 4. Comminuted fracture of the right clavicle. These results were called by telephone at the time of interpretation on 02/17/2019 at 10:52 pm to Dr. Lawrence Marseilles who verbally acknowledged these results. Electronically Signed   By: Elgie Collard M.D.   On: 02/17/2019 23:14   Dg Pelvis Portable  Result Date: 02/17/2019 CLINICAL DATA:  Trauma EXAM: PORTABLE PELVIS 1-2 VIEWS COMPARISON:  None. FINDINGS: SI joints are non widened. Pubic symphysis is intact. Both femoral heads project in joint. Questionable deformities at the left superior and inferior pubic rami. IMPRESSION: Questionable fracture deformities at the left superior and inferior pubic rami. Electronically Signed   By: Jasmine Pang M.D.   On: 02/17/2019 22:10   Dg Chest Port 1 View  Result Date: 02/18/2019 CLINICAL DATA:  Respiratory EXAM: PORTABLE CHEST 1 VIEW COMPARISON:  02/18/2019 FINDINGS: Cardiac shadow is stable. 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 Endotracheal tube and gastric catheter are noted in satisfactory position. Lungs are hypoinflated with mild left retrocardiac atelectasis. Right-sided comminuted clavicular fracture is seen. Rib fractures are also noted posteriorly on the right involving the fourth and fifth ribs. IMPRESSION: Left retrocardiac consolidation Stable right clavicular and right-sided rib fractures. No definitive pneumothorax is noted. Electronically Signed   By: Alcide Clever M.D.   On: 02/18/2019 09:20   Dg Chest Port 1 View  Result Date: 02/18/2019 CLINICAL DATA:  58 year old male postoperative day zero endovascular repair of traumatic injury of the descending thoracic aorta. Bilateral rib  fractures and small pneumothoraces. EXAM: PORTABLE CHEST 1 VIEW COMPARISON:  Portable chest and CT Chest, Abdomen, and Pelvis 02/17/2019. FINDINGS: Portable AP supine view at 0407 hours. Endotracheal tube tip now in place just above the clavicles. Enteric tube courses to the abdomen, side hole identified at the GEJ or just inside the stomach. Stable cardiac size and mediastinal contours. New endograft of the  distal arch and proximal descending thoracic aorta. Continued low lung volumes. No pneumothorax identified. Possible small new left pleural effusion. Increased perihilar and retrocardiac atelectasis. Anterior left rib fractures better demonstrated by CT. Comminuted posterior right upper rib fractures and comminuted but largely nondisplaced right clavicle fracture again noted. IMPRESSION: 1. ET tube tip just above the clavicles. Enteric tube courses to the abdomen, side hole at the GEJ or just inside the stomach. 2. No pneumothorax evident radiographically. Increased perihilar and retrocardiac atelectasis. Possible small left pleural effusion. 3. Stable mediastinal contour, new descending thoracic aortic endograft. 4. Multiple rib and right clavicle fractures better demonstrated by CT. Electronically Signed   By: Odessa Fleming M.D.   On: 02/18/2019 04:30   Dg Chest Port 1 View  Result Date: 02/17/2019 CLINICAL DATA:  Trauma EXAM: PORTABLE CHEST 1 VIEW COMPARISON:  None. FINDINGS: No prior images are available for comparison. There are low lung volumes. The heart is enlarged. Mediastinal silhouette also enlarged. No pneumothorax. Possible acute nondisplaced right mid clavicle fracture IMPRESSION: 1. Low lung volumes. Mediastinum appears enlarged. Recommend CT chest to exclude acute intrathoracic abnormality 2. Mild cardiomegaly 3. Possible right nondisplaced midclavicular fracture Electronically Signed   By: Jasmine Pang M.D.   On: 02/17/2019 22:12    Review of Systems  Unable to perform ROS: Intubated    Blood pressure 128/76, pulse 77, temperature 98.2 F (36.8 C), temperature source Oral, resp. rate 20, height 6' (1.829 m), weight 102 kg, SpO2 100 %. Physical Exam  Constitutional: He appears well-developed and well-nourished. No distress.  HENT:  Head: Normocephalic and atraumatic.  Eyes: Conjunctivae are normal. Right eye exhibits no discharge. Left eye exhibits no discharge. No scleral icterus.  Neck: Normal range of motion.  Cardiovascular: Normal rate and regular rhythm.  Respiratory: Effort normal. No respiratory distress.  Musculoskeletal:     Comments: Right shoulder, elbow, wrist, digits- no skin wounds, ecchymosis supraclavicular fossa, no skin tenting, no instability, no blocks to motion  Sens  Ax/R/M/U could not assess  Mot   Ax/ R/ PIN/ M/ AIN/ U could not assess  Rad 2+  Neurological: He is alert.  Skin: Skin is warm and dry. He is not diaphoretic.  Psychiatric: He has a normal mood and affect. His behavior is normal.    Assessment/Plan: Right clavicle fx -- Will get dedicated films to assess current displacement. Depending on x-rays may elect for operative fixation, likely on Wednesday by Dr. Everardo Pacific. Sling and NWB for now. VDRF Ao injury Multiple right rib fxs    Freeman Caldron, PA-C Orthopedic Surgery [phone] 02/18/2019, 9:49 AM

## 2019-02-18 NOTE — ED Notes (Signed)
Vascular surgeon at bedside.

## 2019-02-18 NOTE — Anesthesia Procedure Notes (Signed)
Procedure Name: Intubation Date/Time: 02/18/2019 2:06 AM Performed by: Edmonia Caprio, CRNA Pre-anesthesia Checklist: Patient identified, Emergency Drugs available, Suction available, Patient being monitored and Timeout performed Patient Re-evaluated:Patient Re-evaluated prior to induction Oxygen Delivery Method: Circle system utilized Preoxygenation: Pre-oxygenation with 100% oxygen Induction Type: IV induction and Rapid sequence Laryngoscope Size: Glidescope and 4 Grade View: Grade I Tube type: Oral Tube size: 7.5 mm Number of attempts: 1 Airway Equipment and Method: Stylet and Video-laryngoscopy Placement Confirmation: ETT inserted through vocal cords under direct vision,  positive ETCO2 and breath sounds checked- equal and bilateral Secured at: 23 cm Tube secured with: Tape Dental Injury: Teeth and Oropharynx as per pre-operative assessment
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
## 2019-02-18 NOTE — Anesthesia Postprocedure Evaluation (Signed)
Anesthesia Post Note  Patient: Randall Cherry  Procedure(s) Performed: THORACIC AORTIC ENDOVASCULAR STENT GRAFT USING A BY 10CM GORE (N/A )     Patient location during evaluation: ICU Anesthesia Type: General Level of consciousness: sedated Pain management: pain level controlled Vital Signs Assessment: post-procedure vital signs reviewed and stable Respiratory status: patient remains intubated per anesthesia plan Cardiovascular status: stable Postop Assessment: no apparent nausea or vomiting Anesthetic complications: no    Last Vitals:  Vitals:   02/18/19 0600 02/18/19 0759  BP:  (!) 144/76  Pulse: 72 68  Resp: 20 19  Temp:  36.8 C  SpO2: 100% 100%    Last Pain:  Vitals:   02/18/19 0759  TempSrc: Oral  PainSc:                  Catheryn Bacon Garyn Arlotta

## 2019-02-18 NOTE — Progress Notes (Signed)
[facility]  Subjective  - Intubated.  No issues with right groin.  Objective 102/65 72 (!) 97.5 F (36.4 C) (Axillary) 20 100%  Intake/Output Summary (Last 24 hours) at 02/18/2019 0727 Last data filed at 02/18/2019 0700 Gross per 24 hour  Intake 3539.33 ml  Output 1400 ml  Net 2139.33 ml   Right groin c/d/i, no hematoma Palpable right femoral pulse Right DP palpable   Laboratory Lab Results: Recent Labs    02/17/19 2155  02/18/19 0405 02/18/19 0411 02/18/19 0620  WBC 6.3  --  15.2*  --   --   HGB 14.2   < > 9.9*  11.3* 8.5* 10.2*  HCT 40.8   < > 29.0*  32.2* 25.0* 30.0*  PLT 255  --  197  --   --    < > = values in this interval not displayed.   BMET Recent Labs    02/17/19 2155 02/17/19 2156  02/18/19 0405 02/18/19 0411 02/18/19 0620  NA 138 138   < > 138  142 143 140  K 3.2* 3.2*   < > 4.0  3.6 2.9* 3.9  CL 104 103  --  109  109  --   --   CO2 21*  --   --  17*  --   --   GLUCOSE 148* 145*  --  136*  127*  --   --   BUN 8 9  --  8  7  --   --   CREATININE 1.16 1.40*  --  0.97  0.80  --   --   CALCIUM 8.6*  --   --  7.3*  --   --    < > = values in this interval not displayed.    COAG Lab Results  Component Value Date   INR 1.3 (H) 02/18/2019   INR 1.1 02/17/2019   No results found for: PTT  Assessment/Planning:  POD#0 s/p TEVAR for grade 3 blunt thoracic injury.  Right groin c/d/i.  Palpable right DP pulse. Needs ASA daily.  No longer needs strict BP control.  Cephus Shelling 02/18/2019 7:27 AM --

## 2019-02-18 NOTE — Transfer of Care (Signed)
Immediate Anesthesia Transfer of Care Note  Patient: Randall Cherry  Procedure(s) Performed: THORACIC AORTIC ENDOVASCULAR STENT GRAFT USING A BY 10CM GORE (N/A )  Patient Location: ICU  Anesthesia Type:General  Level of Consciousness: sedated and Patient remains intubated per anesthesia plan  Airway & Oxygen Therapy: Patient Spontanous Breathing, Patient remains intubated per anesthesia plan and Patient placed on Ventilator (see vital sign flow sheet for setting)  Post-op Assessment: Report given to RN and Post -op Vital signs reviewed and stable  Post vital signs: stable  Last Vitals:  Vitals Value Taken Time  BP    Temp    Pulse 89 02/18/2019  3:55 AM  Resp 17 02/18/2019  3:55 AM  SpO2 100 % 02/18/2019  3:55 AM  Vitals shown include unvalidated device data.  Last Pain:  Vitals:   02/17/19 2359  TempSrc:   PainSc: 7          Complications: No apparent anesthesia complications
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
## 2019-02-18 NOTE — Anesthesia Preprocedure Evaluation (Addendum)
Anesthesia Evaluation  General Assessment Comment:Responds to stimulation  Reviewed: Allergy & Precautions, Patient's Chart, lab work & pertinent test resultsPreop documentation limited or incomplete due to emergent nature of procedure.  Airway        Dental   Pulmonary  Bilateral rib fractures and small bilateral pneumothoraces          Cardiovascular   ECG: SR, rate 92  Traumatic aortic injury involving the aortic isthmus and proximal descending thoracic aorta. There is periaortic hematoma extending from the aortic arch to the descending thoracic aorta as well as surrounding the origins of the great vessels of the aortic arch   Neuro/Psych    GI/Hepatic negative GI ROS, (+)     substance abuse  alcohol use,   Endo/Other  negative endocrine ROS  Renal/GU negative Renal ROS     Musculoskeletal right nondisplaced midclavicular fracture   Abdominal   Peds  Hematology negative hematology ROS (+)   Anesthesia Other Findings Thoracic aneurysm  Reproductive/Obstetrics                            Anesthesia Physical Anesthesia Plan  ASA: IV and emergent  Anesthesia Plan: General   Post-op Pain Management:    Induction: Intravenous and Rapid sequence  PONV Risk Score and Plan: 2 and Ondansetron, Dexamethasone, Midazolam and Treatment may vary due to age or medical condition  Airway Management Planned: Oral ETT  Additional Equipment: Arterial line  Intra-op Plan:   Post-operative Plan: Possible Post-op intubation/ventilation  Informed Consent:     Only emergency history available  Plan Discussed with: CRNA and Surgeon  Anesthesia Plan Comments:        Anesthesia Quick Evaluation
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
## 2019-02-19 ENCOUNTER — Encounter (HOSPITAL_COMMUNITY): Payer: Self-pay | Admitting: Vascular Surgery

## 2019-02-19 ENCOUNTER — Inpatient Hospital Stay (HOSPITAL_COMMUNITY): Payer: BC Managed Care – PPO

## 2019-02-19 LAB — BASIC METABOLIC PANEL
Anion gap: 8 (ref 5–15)
BUN: 8 mg/dL (ref 6–20)
CO2: 22 mmol/L (ref 22–32)
Calcium: 7.9 mg/dL — ABNORMAL LOW (ref 8.9–10.3)
Chloride: 108 mmol/L (ref 98–111)
Creatinine, Ser: 0.84 mg/dL (ref 0.61–1.24)
GFR calc Af Amer: >60 mL/min (ref >60)
GFR calc non Af Amer: >60 mL/min (ref >60)
Glucose, Bld: 137 mg/dL — ABNORMAL HIGH (ref 70–99)
Potassium: 3.5 mmol/L (ref 3.5–5.1)
Sodium: 138 mmol/L (ref 135–145)

## 2019-02-19 LAB — GLUCOSE, CAPILLARY: Glucose-Capillary: 121 mg/dL — ABNORMAL HIGH (ref 70–99)

## 2019-02-19 LAB — CBC
HCT: 30.4 % — ABNORMAL LOW (ref 39.0–52.0)
Hemoglobin: 10.8 g/dL — ABNORMAL LOW (ref 13.0–17.0)
MCH: 34.2 pg — ABNORMAL HIGH (ref 26.0–34.0)
MCHC: 35.5 g/dL (ref 30.0–36.0)
MCV: 96.2 fL (ref 80.0–100.0)
Platelets: 172 10*3/uL (ref 150–400)
RBC: 3.16 MIL/uL — ABNORMAL LOW (ref 4.22–5.81)
RDW: 13 % (ref 11.5–15.5)
WBC: 10.9 10*3/uL — ABNORMAL HIGH (ref 4.0–10.5)
nRBC: 0 % (ref 0.0–0.2)

## 2019-02-19 LAB — TRIGLYCERIDES: Triglycerides: 172 mg/dL — ABNORMAL HIGH (ref <150)

## 2019-02-19 MED ORDER — HYDROMORPHONE HCL 1 MG/ML IJ SOLN
1.0000 mg | INTRAMUSCULAR | Status: DC | PRN
  Administered 2019-02-19 – 2019-02-24 (×15): 1 mg via INTRAVENOUS
  Filled 2019-02-19 (×15): qty 1

## 2019-02-19 MED ORDER — ENOXAPARIN SODIUM 40 MG/0.4ML ~~LOC~~ SOLN
40.0000 mg | SUBCUTANEOUS | Status: DC
Start: 2019-02-19 — End: 2019-02-25
  Administered 2019-02-19 – 2019-02-25 (×7): 40 mg via SUBCUTANEOUS
  Filled 2019-02-19 (×7): qty 0.4

## 2019-02-19 MED ORDER — LABETALOL HCL 5 MG/ML IV SOLN
10.0000 mg | INTRAVENOUS | Status: DC | PRN
  Administered 2019-02-19 – 2019-02-23 (×4): 10 mg via INTRAVENOUS
  Filled 2019-02-19 (×5): qty 4

## 2019-02-19 MED ORDER — HYDROMORPHONE HCL 1 MG/ML IJ SOLN: 0.5000 mg | INTRAMUSCULAR | Status: DC | PRN

## 2019-02-19 MED ORDER — HYDRALAZINE HCL 20 MG/ML IJ SOLN
10.0000 mg | Freq: Four times a day (QID) | INTRAMUSCULAR | Status: DC | PRN
  Administered 2019-02-19 – 2019-02-24 (×6): 10 mg via INTRAVENOUS
  Filled 2019-02-19 (×6): qty 1

## 2019-02-19 MED ORDER — IPRATROPIUM-ALBUTEROL 0.5-2.5 (3) MG/3ML IN SOLN
3.0000 mL | Freq: Four times a day (QID) | RESPIRATORY_TRACT | Status: DC
  Administered 2019-02-19 (×2): 3 mL via RESPIRATORY_TRACT
  Filled 2019-02-19 (×3): qty 3

## 2019-02-19 NOTE — Progress Notes (Signed)
Assisted tele visit to patient with daughter.  Hidaya Daniel Ferrer, RN  

## 2019-02-19 NOTE — Procedures (Signed)
Extubation Procedure Note  Patient Details:   Name: Randall Cherry DOB: [birth year] MRN: [id number]   Airway Documentation:    Vent end date: 02/19/19 Vent end time: 1223   Evaluation  O2 sats: stable throughout Complications: No apparent complications Patient did tolerate procedure well. Bilateral Breath Sounds: Clear   Yes  Audrie Lia 02/19/2019, 12:26 PM

## 2019-02-19 NOTE — Progress Notes (Addendum)
  Progress Note    02/19/2019 7:56 AM 1 Day Post-Op  Subjective:  Intubated.  Shakes head yes to questions.   Tm 100.6 now afebrile 99% .[id number]   Vitals:   02/19/19 0715 02/19/19 0752  BP: 134/75 125/74  Pulse: 75   Resp: 20 19  Temp:    SpO2: 99%     Physical Exam: Cardiac:  regular Lungs:  Intubated on .40 FiO2 Incisions:  Right groin is soft without hematoma Extremities:  +palpable PT pulses right>left; motor and sensation are in tact Abdomen:  Soft, NT/ND  CBC    Component Value Date/Time   WBC 10.9 (H) 02/19/2019 0336   RBC 3.16 (L) 02/19/2019 0336   HGB 10.8 (L) 02/19/2019 0336   HCT 30.4 (L) 02/19/2019 0336   PLT 172 02/19/2019 0336   MCV 96.2 02/19/2019 0336   MCH 34.2 (H) 02/19/2019 0336   MCHC 35.5 02/19/2019 0336   RDW 13.0 02/19/2019 0336    BMET    Component Value Date/Time   NA 138 02/19/2019 0336   K 3.5 02/19/2019 0336   CL 108 02/19/2019 0336   CO2 22 02/19/2019 0336   GLUCOSE 137 (H) 02/19/2019 0336   BUN 8 02/19/2019 0336   CREATININE 0.84 02/19/2019 0336   CALCIUM 7.9 (L) 02/19/2019 0336   GFRNONAA >60 02/19/2019 0336   GFRAA >60 02/19/2019 0336    INR    Component Value Date/Time   INR 1.3 (H) 02/18/2019 0405     Intake/Output Summary (Last 24 hours) at 02/19/2019 0756 Last data filed at 02/19/2019 0721 Gross per 24 hour  Intake 3396.49 ml  Output 1570 ml  Net 1826.49 ml     Assessment:  58 y.o. male is s/p:  1.  Ultrasound-guided percutaneous access and closure of the right common femoral artery for delivery of endograft 2.  Introduction of catheter in aorta with arch/thoracic aortogram 3.  Endovascular repair of descending thoracic aorta not involving coverage of the left subclavian artery (28 mm x 10 cm Gore TAG)  1 Day Post-Op  Plan: -pt remains intubated-hopeful for extubation today -pt right groin soft without hematoma -+palpable PT pulses right>left; motor and sensation are in tact -creatinine normal -hgb  stable    Doreatha Massed, PA-C [facility] [phone] 02/19/2019 7:56 AM  I have seen and evaluated the patient. I agree with the PA note as documented above. Doing well POD#1 s/p TEVAR for blunt aortic injury grade 3.  Aspirin from my standpoint.  Right groin c/d/i.  Right PT palpable.  Foot motor and sensory intact.  Weaning to extubate today otherwise.    Cephus Shelling, MD [facility] [place] Office: [phone] Pager: [phone]
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
## 2019-02-20 LAB — TRIGLYCERIDES: Triglycerides: 144 mg/dL (ref <150)

## 2019-02-20 LAB — POCT I-STAT 7, (LYTES, BLD GAS, ICA,H+H)
Acid-base deficit: 2 mmol/L (ref 0.0–2.0)
Bicarbonate: 23.5 mmol/L (ref 20.0–28.0)
Calcium, Ion: 1.22 mmol/L (ref 1.15–1.40)
HCT: 32 % — ABNORMAL LOW (ref 39.0–52.0)
Hemoglobin: 10.9 g/dL — ABNORMAL LOW (ref 13.0–17.0)
O2 Saturation: 93 %
Patient temperature: 98.4
Potassium: 3.9 mmol/L (ref 3.5–5.1)
Sodium: 135 mmol/L (ref 135–145)
TCO2: 25 mmol/L (ref 22–32)
pCO2 arterial: 39.8 mmHg (ref 32.0–48.0)
pH, Arterial: 7.379 (ref 7.350–7.450)
pO2, Arterial: 68 mmHg — ABNORMAL LOW (ref 83.0–108.0)

## 2019-02-20 LAB — CBC
HCT: 35.1 % — ABNORMAL LOW (ref 39.0–52.0)
Hemoglobin: 12.5 g/dL — ABNORMAL LOW (ref 13.0–17.0)
MCH: 34.2 pg — ABNORMAL HIGH (ref 26.0–34.0)
MCHC: 35.6 g/dL (ref 30.0–36.0)
MCV: 95.9 fL (ref 80.0–100.0)
Platelets: 198 10*3/uL (ref 150–400)
RBC: 3.66 MIL/uL — ABNORMAL LOW (ref 4.22–5.81)
RDW: 12.7 % (ref 11.5–15.5)
WBC: 15.9 10*3/uL — ABNORMAL HIGH (ref 4.0–10.5)
nRBC: 0 % (ref 0.0–0.2)

## 2019-02-20 LAB — BASIC METABOLIC PANEL
Anion gap: 11 (ref 5–15)
BUN: 7 mg/dL (ref 6–20)
CO2: 24 mmol/L (ref 22–32)
Calcium: 8.7 mg/dL — ABNORMAL LOW (ref 8.9–10.3)
Chloride: 103 mmol/L (ref 98–111)
Creatinine, Ser: 0.84 mg/dL (ref 0.61–1.24)
GFR calc Af Amer: >60 mL/min (ref >60)
GFR calc non Af Amer: >60 mL/min (ref >60)
Glucose, Bld: 137 mg/dL — ABNORMAL HIGH (ref 70–99)
Potassium: 3.8 mmol/L (ref 3.5–5.1)
Sodium: 138 mmol/L (ref 135–145)

## 2019-02-20 MED ORDER — IPRATROPIUM-ALBUTEROL 0.5-2.5 (3) MG/3ML IN SOLN
3.0000 mL | Freq: Three times a day (TID) | RESPIRATORY_TRACT | Status: DC
  Administered 2019-02-20 – 2019-02-22 (×8): 3 mL via RESPIRATORY_TRACT
  Filled 2019-02-20 (×10): qty 3

## 2019-02-20 MED ORDER — OXYCODONE HCL 5 MG PO TABS
5.0000 mg | ORAL_TABLET | ORAL | Status: DC | PRN
  Administered 2019-02-21 – 2019-02-25 (×7): 5 mg via ORAL
  Filled 2019-02-20 (×7): qty 1

## 2019-02-20 MED ORDER — LORAZEPAM 1 MG PO TABS: 1.0000 mg | ORAL_TABLET | Freq: Four times a day (QID) | ORAL | Status: DC | PRN

## 2019-02-20 MED ORDER — LORAZEPAM 2 MG/ML IJ SOLN
1.0000 mg | INTRAMUSCULAR | Status: DC | PRN
  Administered 2019-02-21 (×2): 1 mg via INTRAVENOUS
  Filled 2019-02-20 (×2): qty 1

## 2019-02-20 MED ORDER — IPRATROPIUM-ALBUTEROL 0.5-2.5 (3) MG/3ML IN SOLN
3.0000 mL | Freq: Four times a day (QID) | RESPIRATORY_TRACT | Status: DC | PRN
  Administered 2019-02-20: 3 mL via RESPIRATORY_TRACT

## 2019-02-20 MED ORDER — BOOST / RESOURCE BREEZE PO LIQD CUSTOM
1.0000 | Freq: Three times a day (TID) | ORAL | Status: DC
  Administered 2019-02-20 – 2019-02-22 (×4): 1 via ORAL

## 2019-02-20 MED ORDER — LORAZEPAM 2 MG/ML IJ SOLN
2.0000 mg | Freq: Once | INTRAMUSCULAR | Status: AC
  Administered 2019-02-20: 2 mg via INTRAVENOUS
  Filled 2019-02-20: qty 1

## 2019-02-20 MED ORDER — ACETAMINOPHEN 325 MG PO TABS
650.0000 mg | ORAL_TABLET | Freq: Four times a day (QID) | ORAL | Status: DC
  Administered 2019-02-20 – 2019-02-25 (×18): 650 mg via ORAL
  Filled 2019-02-20 (×18): qty 2

## 2019-02-20 NOTE — Progress Notes (Signed)
Patient has labored breathing, tachypnea, wheezing, and SOB. Respiratory at bedside. Paged Kinsinger, MD. Will order ABG and place patient on BiPAP if needed.

## 2019-02-20 NOTE — Progress Notes (Addendum)
  Progress Note    02/20/2019 8:18 AM 2 Days Post-Op  Subjective:  No complaints  Afebrile 130's-160's systolic HR 90's-110's  97% [id number]  Vitals:   02/20/19 0752 02/20/19 0813  BP:    Pulse:    Resp:    Temp:  98.4 F (36.9 C)  SpO2: 96%     Physical Exam: Cardiac:  regular Lungs:  Non labored Incisions:  Right groin is soft without hematoma Extremities:  Palpable DP pulses bilaterally Abdomen:  Soft, NT  CBC    Component Value Date/Time   WBC 15.9 (H) 02/20/2019 0259   RBC 3.66 (L) 02/20/2019 0259   HGB 12.5 (L) 02/20/2019 0259   HCT 35.1 (L) 02/20/2019 0259   PLT 198 02/20/2019 0259   MCV 95.9 02/20/2019 0259   MCH 34.2 (H) 02/20/2019 0259   MCHC 35.6 02/20/2019 0259   RDW 12.7 02/20/2019 0259    BMET    Component Value Date/Time   NA 138 02/20/2019 0259   K 3.8 02/20/2019 0259   CL 103 02/20/2019 0259   CO2 24 02/20/2019 0259   GLUCOSE 137 (H) 02/20/2019 0259   BUN 7 02/20/2019 0259   CREATININE 0.84 02/20/2019 0259   CALCIUM 8.7 (L) 02/20/2019 0259   GFRNONAA >60 02/20/2019 0259   GFRAA >60 02/20/2019 0259    INR    Component Value Date/Time   INR 1.3 (H) 02/18/2019 0405     Intake/Output Summary (Last 24 hours) at 02/20/2019 0818 Last data filed at 02/20/2019 0700 Gross per 24 hour  Intake 2596.27 ml  Output 1005 ml  Net 1591.27 ml     Assessment:  58 y.o. male is s/p:  TEVAR  2 Days Post-Op  Plan: -pt doing well with palpable pulses -anemia improved -increase in leukocytosis-afebrile; continue IS -continue asa  -DVT prophylaxis:  Lovenox -transfer per primary team   Doreatha Massed, PA-C [facility] [phone] 02/20/2019 8:18 AM  I have seen and evaluated the patient. I agree with the PA note as documented above. Doing well POD#2 s/p TEVAR for blunt thoracic aortic injury.  Extubated.  Right groin looks good.  Right PT palpable.    Cephus Shelling, MD [facility] [place]  Office: [phone] Pager: [phone]
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
## 2019-02-20 NOTE — Progress Notes (Signed)
CIWA was 8 around 1800 and dayshift RN administered the PRN 1mg  of Ativan. CIWA now up to 17 and PRN dosage only q6 hours. Patient climbing out of bed, pulled out of IV, confused, and continues to use arm with broken clavicle. Paged Kinsinger, MD. Orders to keep patient in ICU overnight and give 2mg  of IV Ativan once.
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
## 2019-02-20 NOTE — Progress Notes (Signed)
Orthopedic Tech Progress Note Patient Details:  Randall Cherry 12-19-1960 [id number]  Ortho Devices Type of Ortho Device: Shoulder immobilizer Ortho Device/Splint Interventions: Application   Post Interventions Patient Tolerated: Well   Saul Fordyce 02/20/2019, 11:00 AMright

## 2019-02-21 ENCOUNTER — Inpatient Hospital Stay (HOSPITAL_COMMUNITY): Payer: BC Managed Care – PPO

## 2019-02-21 LAB — BASIC METABOLIC PANEL
Anion gap: 11 (ref 5–15)
BUN: 7 mg/dL (ref 6–20)
CO2: 24 mmol/L (ref 22–32)
Calcium: 8.8 mg/dL — ABNORMAL LOW (ref 8.9–10.3)
Chloride: 101 mmol/L (ref 98–111)
Creatinine, Ser: 0.8 mg/dL (ref 0.61–1.24)
GFR calc Af Amer: >60 mL/min (ref >60)
GFR calc non Af Amer: >60 mL/min (ref >60)
Glucose, Bld: 131 mg/dL — ABNORMAL HIGH (ref 70–99)
Potassium: 3.6 mmol/L (ref 3.5–5.1)
Sodium: 136 mmol/L (ref 135–145)

## 2019-02-21 LAB — BPAM RBC
Blood Product Expiration Date: 202006242359
Blood Product Expiration Date: 202006242359
ISSUE DATE / TIME: 202005280532
ISSUE DATE / TIME: 202005310000
Unit Type and Rh: 5100
Unit Type and Rh: 5100

## 2019-02-21 LAB — CBC
HCT: 34.3 % — ABNORMAL LOW (ref 39.0–52.0)
Hemoglobin: 12.3 g/dL — ABNORMAL LOW (ref 13.0–17.0)
MCH: 34.1 pg — ABNORMAL HIGH (ref 26.0–34.0)
MCHC: 35.9 g/dL (ref 30.0–36.0)
MCV: 95 fL (ref 80.0–100.0)
Platelets: 216 10*3/uL (ref 150–400)
RBC: 3.61 MIL/uL — ABNORMAL LOW (ref 4.22–5.81)
RDW: 12.8 % (ref 11.5–15.5)
WBC: 13.4 10*3/uL — ABNORMAL HIGH (ref 4.0–10.5)
nRBC: 0 % (ref 0.0–0.2)

## 2019-02-21 LAB — TYPE AND SCREEN
ABO/RH(D): O NEG
Antibody Screen: NEGATIVE
Unit division: 0
Unit division: 0

## 2019-02-21 MED ORDER — FUROSEMIDE 10 MG/ML IJ SOLN
40.0000 mg | Freq: Once | INTRAMUSCULAR | Status: AC
  Administered 2019-02-21: 40 mg via INTRAVENOUS
  Filled 2019-02-21: qty 4

## 2019-02-21 MED ORDER — DEXMEDETOMIDINE HCL IN NACL 200 MCG/50ML IV SOLN: 0.4000 ug/kg/h | INTRAVENOUS | Status: DC

## 2019-02-21 MED ORDER — LORAZEPAM 2 MG/ML IJ SOLN
2.0000 mg | INTRAMUSCULAR | Status: DC | PRN
  Administered 2019-02-21 – 2019-02-23 (×2): 2 mg via INTRAVENOUS
  Filled 2019-02-21 (×2): qty 1

## 2019-02-21 MED ORDER — POTASSIUM CHLORIDE 10 MEQ/100ML IV SOLN
10.0000 meq | INTRAVENOUS | Status: AC
  Administered 2019-02-21 (×2): 10 meq via INTRAVENOUS
  Filled 2019-02-21 (×2): qty 100

## 2019-02-21 MED ORDER — DEXMEDETOMIDINE HCL IN NACL 400 MCG/100ML IV SOLN
0.4000 ug/kg/h | INTRAVENOUS | Status: DC
  Administered 2019-02-21: 0.6 ug/kg/h via INTRAVENOUS
  Administered 2019-02-21: 0.793 ug/kg/h via INTRAVENOUS
  Administered 2019-02-21: 1 ug/kg/h via INTRAVENOUS
  Administered 2019-02-21: 0.6 ug/kg/h via INTRAVENOUS
  Administered 2019-02-22: 0.8 ug/kg/h via INTRAVENOUS
  Administered 2019-02-22: 0.5 ug/kg/h via INTRAVENOUS
  Administered 2019-02-22: 0.2 ug/kg/h via INTRAVENOUS
  Administered 2019-02-22: 0.9 ug/kg/h via INTRAVENOUS
  Filled 2019-02-21 (×5): qty 100
  Filled 2019-02-21: qty 200
  Filled 2019-02-21: qty 100

## 2019-02-21 MED ORDER — ALBUTEROL SULFATE (2.5 MG/3ML) 0.083% IN NEBU
2.5000 mg | INHALATION_SOLUTION | RESPIRATORY_TRACT | Status: DC | PRN
  Administered 2019-02-21 (×2): 2.5 mg via RESPIRATORY_TRACT
  Filled 2019-02-21 (×2): qty 3

## 2019-02-21 NOTE — Progress Notes (Signed)
Placed patient on 4 Lpm nasal cannula at this time as patient had pulled BiPAP off and wouldn't allow me to put it back on. RN aware. Will continue to monitor.

## 2019-02-21 NOTE — Progress Notes (Addendum)
RT to bedside to assess pt for BIPAP. Pt asleep, no obvious respiratory distress noted at this time. Vitals currently HR 75, RR 17, SpO2 99% on 5L [place]. Continuous BIPAP order changed to PRN per RT protocol. RT will continue to monitor.

## 2019-02-21 NOTE — Progress Notes (Addendum)
Patient non-compliant with usage of sling and NWB instructions for right arm. Patient educated continuously throughout the night.

## 2019-02-21 NOTE — Progress Notes (Signed)
CXR showing showing pulmonary edema per radiology impression. Paged Kinsinger, MD. Orders for 40mg  IV Lasix.

## 2019-02-21 NOTE — Progress Notes (Signed)
Patient with increase work of breathing and allowing me to place BiPAP back on at this time. RN aware.

## 2019-02-21 NOTE — Progress Notes (Signed)
Upon arrival, patient had woke up confused and pulled BiPAP off. Patient became labored immediately with audible wheezes. Explained to patient what was going on and placed him back on the BiPAP. Patient resting comfortably at this time.

## 2019-02-21 NOTE — Progress Notes (Signed)
Patient continues to have increased work of breathing with usage of accessory muscles. Audible wheezing noted despite neb treatments. Patient will wear BiPAP for a couple of hours, but then will remove it when waking up confused. Respiratory at bedside. Paged Kinsinger, MD. Will order chest x-ray.

## 2019-02-21 NOTE — Progress Notes (Signed)
Made aware patient pulled BiPAP mask again. RN placed patient on 4 Lpm nasal cannula. Patient is more alert. RN to notify RT if needs to be put back on BiPAP.
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
## 2019-02-21 NOTE — Progress Notes (Signed)
RT Placed pt back on NIV/BIPAP/PCV on the servo as requested by RN.  Pt tolerating well. RT will continue to monitor.

## 2019-02-21 NOTE — Progress Notes (Signed)
PT Cancellation Note  Patient Details Name: Randall Cherry MRN: [id number] DOB: 02-01-1961   Cancelled Treatment:    Reason Eval/Treat Not Completed: Medical issues which prohibited therapy(Per nurse, pt confused, combative and in resp distress. )Will check back tomorrow.    Berline Lopes 02/21/2019, 11:53 AM Maddisen Vought,PT Acute Rehabilitation Services Pager:  [phone]  Office:  [phone]

## 2019-02-21 NOTE — Progress Notes (Signed)
  Progress Note    02/21/2019 8:31 AM   Subjective: Extubated yesterday.  Bipap this am.    Vitals:   02/21/19 0700 02/21/19 0800  BP: 132/83 (!) 134/94  Pulse: 96 96  Resp: 11 11  Temp:  99.8 F (37.7 C)  SpO2: 99% 100%    Physical Exam: Incisions:  Right groin is soft without hematoma Extremities:  Palpable PT pulses bilaterally   CBC    Component Value Date/Time   WBC 13.4 (H) 02/21/2019 0346   RBC 3.61 (L) 02/21/2019 0346   HGB 12.3 (L) 02/21/2019 0346   HCT 34.3 (L) 02/21/2019 0346   PLT 216 02/21/2019 0346   MCV 95.0 02/21/2019 0346   MCH 34.1 (H) 02/21/2019 0346   MCHC 35.9 02/21/2019 0346   RDW 12.8 02/21/2019 0346    BMET    Component Value Date/Time   NA 136 02/21/2019 0346   K 3.6 02/21/2019 0346   CL 101 02/21/2019 0346   CO2 24 02/21/2019 0346   GLUCOSE 131 (H) 02/21/2019 0346   BUN 7 02/21/2019 0346   CREATININE 0.80 02/21/2019 0346   CALCIUM 8.8 (L) 02/21/2019 0346   GFRNONAA >60 02/21/2019 0346   GFRAA >60 02/21/2019 0346    INR    Component Value Date/Time   INR 1.3 (H) 02/18/2019 0405     Intake/Output Summary (Last 24 hours) at 02/21/2019 0831 Last data filed at 02/21/2019 0808 Gross per 24 hour  Intake 2325.1 ml  Output 2850 ml  Net -524.9 ml     Assessment:  58 y.o. male is s/p:   POD#3 s/p TEVAR for blunt thoracic aortic injury.  Right groin looks good.  Right PT palpable.  Will sign off.  Call with questions or concerns.  Will arrange follow-up in one month with CTA chest.  Cephus Shelling, MD [facility] Office: [phone] Pager: [phone]  Cephus Shelling

## 2019-02-21 NOTE — Progress Notes (Signed)
Placed patient on BiPAP at this time for respiratory distress. Will continue to monitor.

## 2019-02-21 NOTE — Progress Notes (Signed)
RT note: patient taken off of bipap and placed on 4L nasal cannula to give patient a break.  Instructed patient that RT will put it back on but wanted to give time for a break.  Sats currently 97%.  Will continue to monitor.

## 2019-02-21 NOTE — Progress Notes (Signed)
Upper airway wheeze noted prior to scheduled neb tx, mostly clear lungs bilaterally. Pt with strong NPC during tx, and continues to have upper airway wheeze post tx. RT will continue to monitor.

## 2019-02-22 ENCOUNTER — Inpatient Hospital Stay (HOSPITAL_COMMUNITY): Payer: BC Managed Care – PPO

## 2019-02-22 ENCOUNTER — Other Ambulatory Visit: Payer: Self-pay

## 2019-02-22 LAB — BASIC METABOLIC PANEL
Anion gap: 10 (ref 5–15)
BUN: 14 mg/dL (ref 6–20)
CO2: 25 mmol/L (ref 22–32)
Calcium: 8.5 mg/dL — ABNORMAL LOW (ref 8.9–10.3)
Chloride: 104 mmol/L (ref 98–111)
Creatinine, Ser: 0.71 mg/dL (ref 0.61–1.24)
GFR calc Af Amer: >60 mL/min (ref >60)
GFR calc non Af Amer: >60 mL/min (ref >60)
Glucose, Bld: 153 mg/dL — ABNORMAL HIGH (ref 70–99)
Potassium: 3.1 mmol/L — ABNORMAL LOW (ref 3.5–5.1)
Sodium: 139 mmol/L (ref 135–145)

## 2019-02-22 LAB — CBC
HCT: 30.7 % — ABNORMAL LOW (ref 39.0–52.0)
Hemoglobin: 11.1 g/dL — ABNORMAL LOW (ref 13.0–17.0)
MCH: 34.4 pg — ABNORMAL HIGH (ref 26.0–34.0)
MCHC: 36.2 g/dL — ABNORMAL HIGH (ref 30.0–36.0)
MCV: 95 fL (ref 80.0–100.0)
Platelets: 239 10*3/uL (ref 150–400)
RBC: 3.23 MIL/uL — ABNORMAL LOW (ref 4.22–5.81)
RDW: 12.8 % (ref 11.5–15.5)
WBC: 8.8 10*3/uL (ref 4.0–10.5)
nRBC: 0 % (ref 0.0–0.2)

## 2019-02-22 MED ORDER — BISACODYL 5 MG PO TBEC
10.0000 mg | DELAYED_RELEASE_TABLET | Freq: Every day | ORAL | Status: DC | PRN
  Administered 2019-02-22: 10 mg via ORAL
  Filled 2019-02-22: qty 2

## 2019-02-22 MED ORDER — DOCUSATE SODIUM 100 MG PO CAPS
100.0000 mg | ORAL_CAPSULE | Freq: Two times a day (BID) | ORAL | Status: DC | PRN
  Administered 2019-02-22: 100 mg via ORAL
  Filled 2019-02-22: qty 1

## 2019-02-22 MED ORDER — METHOCARBAMOL 750 MG PO TABS
750.0000 mg | ORAL_TABLET | Freq: Three times a day (TID) | ORAL | Status: DC | PRN
  Administered 2019-02-24 – 2019-02-25 (×4): 750 mg via ORAL
  Filled 2019-02-22 (×4): qty 1

## 2019-02-22 MED ORDER — ALBUTEROL SULFATE (2.5 MG/3ML) 0.083% IN NEBU
2.5000 mg | INHALATION_SOLUTION | RESPIRATORY_TRACT | Status: DC | PRN
  Administered 2019-02-22: 2.5 mg via RESPIRATORY_TRACT
  Filled 2019-02-22: qty 3

## 2019-02-22 MED ORDER — ENSURE ENLIVE PO LIQD
237.0000 mL | Freq: Two times a day (BID) | ORAL | Status: DC
  Administered 2019-02-22 – 2019-02-25 (×4): 237 mL via ORAL

## 2019-02-22 MED ORDER — POTASSIUM CHLORIDE CRYS ER 20 MEQ PO TBCR
40.0000 meq | EXTENDED_RELEASE_TABLET | Freq: Two times a day (BID) | ORAL | Status: AC
  Administered 2019-02-22 (×2): 40 meq via ORAL
  Filled 2019-02-22 (×2): qty 2
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
## 2019-02-22 NOTE — Plan of Care (Deleted)
Patient remains drowsy, follows commands, BP remains labile, neo gtt restarted for pressure maintenance per protocol.  Chest tubes in tact with minimal drainage.  Patient complaints of chest discomfort and feeling like he has to urinate.  Encouraged to cough and deep breath.  Vagals with coughing, dropping pressure in the 70's systolic, but recovers quickly.  Will delay dangling on side of bed for now.

## 2019-02-22 NOTE — Plan of Care (Signed)
Patient remains drowsy, but rouses easily.  Follows commands, remains cooperative.  Rates pain 8/10.  Will monitor.
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
## 2019-02-23 LAB — BASIC METABOLIC PANEL
Anion gap: 9 (ref 5–15)
BUN: 10 mg/dL (ref 6–20)
CO2: 26 mmol/L (ref 22–32)
Calcium: 8.6 mg/dL — ABNORMAL LOW (ref 8.9–10.3)
Chloride: 103 mmol/L (ref 98–111)
Creatinine, Ser: 0.72 mg/dL (ref 0.61–1.24)
GFR calc Af Amer: >60 mL/min (ref >60)
GFR calc non Af Amer: >60 mL/min (ref >60)
Glucose, Bld: 118 mg/dL — ABNORMAL HIGH (ref 70–99)
Potassium: 2.9 mmol/L — ABNORMAL LOW (ref 3.5–5.1)
Sodium: 138 mmol/L (ref 135–145)

## 2019-02-23 MED ORDER — POTASSIUM CHLORIDE CRYS ER 20 MEQ PO TBCR
40.0000 meq | EXTENDED_RELEASE_TABLET | Freq: Three times a day (TID) | ORAL | Status: AC
  Administered 2019-02-23 (×3): 40 meq via ORAL
  Filled 2019-02-23 (×3): qty 2

## 2019-02-23 MED ORDER — POTASSIUM CHLORIDE CRYS ER 20 MEQ PO TBCR: 40.0000 meq | EXTENDED_RELEASE_TABLET | Freq: Two times a day (BID) | ORAL | Status: DC
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
## 2019-02-23 NOTE — TOC Progression Note (Signed)
Transition of Care [facility]) - Progression Note    Patient Details  Name: Baudelio Karnes MRN: [id number] Date of Birth: Sep 10, 1961  Transition of Care [facility]) CM/SW Contact  Bartholomew Crews, RN Phone Number: [phone] 02/23/2019, 1:16 PM  Clinical Narrative:    Spoke with patient at the bedside. Discussed choice for PT and OT - offered choice list and discussed quality stars and patient survey ratings. Referral accepted by [facility] who will follow for discharge. [facility] notified for 3N1. Patient will need HH orders for PT/OT with Face to Face and DME order for 3N1. CM to follow for transition of care needs.    Expected Discharge Plan: [place] Barriers to Discharge: Continued Medical Work up  Expected Discharge Plan and Services Expected Discharge Plan: [place] In-house Referral: Clinical Social Work Discharge Planning Services: CM Consult Post Acute Care Choice: Durable Medical Equipment Living arrangements for the past 2 months: Single Family Home                 DME Arranged: 3-N-1 DME Agency: [facility]       HH Arranged: PT, OT HH Agency: [facility] Date [place]: 02/23/19 Time [place]: 82 Representative spoke with at [place]: [place] ([place]) Interventions    Readmission Risk Interventions No flowsheet data found.
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
## 2019-02-24 LAB — BASIC METABOLIC PANEL
Anion gap: 11 (ref 5–15)
BUN: 12 mg/dL (ref 6–20)
CO2: 24 mmol/L (ref 22–32)
Calcium: 9.4 mg/dL (ref 8.9–10.3)
Chloride: 104 mmol/L (ref 98–111)
Creatinine, Ser: 0.72 mg/dL (ref 0.61–1.24)
GFR calc Af Amer: >60 mL/min (ref >60)
GFR calc non Af Amer: >60 mL/min (ref >60)
Glucose, Bld: 134 mg/dL — ABNORMAL HIGH (ref 70–99)
Potassium: 3.9 mmol/L (ref 3.5–5.1)
Sodium: 139 mmol/L (ref 135–145)

## 2019-02-24 MED ORDER — METOPROLOL TARTRATE 50 MG PO TABS
50.0000 mg | ORAL_TABLET | Freq: Two times a day (BID) | ORAL | Status: DC
  Administered 2019-02-24 (×2): 50 mg via ORAL
  Filled 2019-02-24 (×2): qty 1
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
## 2019-02-24 NOTE — Progress Notes (Addendum)
Pt short of breath at this time. VS BP 164/96. HR 124, RR 24, O2 sat at 90 room air. Bilateral lung sound clear but diminished.  2L O2 per nasal canula provided for comfort. Educated with use of Incentive spirometry. PRN pain medication given. PRN ativan given for CIWA score of 10.  Pt stayed calm for a few minutes, breathing gotten better. Willl monitor pt.

## 2019-02-25 LAB — CBC
HCT: 32.6 % — ABNORMAL LOW (ref 39.0–52.0)
Hemoglobin: 11.4 g/dL — ABNORMAL LOW (ref 13.0–17.0)
MCH: 33.4 pg (ref 26.0–34.0)
MCHC: 35 g/dL (ref 30.0–36.0)
MCV: 95.6 fL (ref 80.0–100.0)
Platelets: 359 10*3/uL (ref 150–400)
RBC: 3.41 MIL/uL — ABNORMAL LOW (ref 4.22–5.81)
RDW: 12.9 % (ref 11.5–15.5)
WBC: 9.7 10*3/uL (ref 4.0–10.5)
nRBC: 0 % (ref 0.0–0.2)

## 2019-02-25 LAB — BASIC METABOLIC PANEL
Anion gap: 10 (ref 5–15)
BUN: 13 mg/dL (ref 6–20)
CO2: 23 mmol/L (ref 22–32)
Calcium: 8.7 mg/dL — ABNORMAL LOW (ref 8.9–10.3)
Chloride: 102 mmol/L (ref 98–111)
Creatinine, Ser: 0.74 mg/dL (ref 0.61–1.24)
GFR calc Af Amer: >60 mL/min (ref >60)
GFR calc non Af Amer: >60 mL/min (ref >60)
Glucose, Bld: 110 mg/dL — ABNORMAL HIGH (ref 70–99)
Potassium: 3.7 mmol/L (ref 3.5–5.1)
Sodium: 135 mmol/L (ref 135–145)

## 2019-02-25 MED ORDER — OXYCODONE HCL 5 MG PO TABS
5.0000 mg | ORAL_TABLET | ORAL | Status: DC | PRN
  Administered 2019-02-25 (×2): 10 mg via ORAL
  Filled 2019-02-25 (×2): qty 2

## 2019-02-25 MED ORDER — METHOCARBAMOL 750 MG PO TABS: 750.0000 mg | ORAL_TABLET | Freq: Three times a day (TID) | ORAL | 0 refills | Status: DC | PRN

## 2019-02-25 MED ORDER — ACETAMINOPHEN 325 MG PO TABS: 650.0000 mg | ORAL_TABLET | Freq: Four times a day (QID) | ORAL | Status: DC

## 2019-02-25 MED ORDER — DOCUSATE SODIUM 100 MG PO CAPS: 100.0000 mg | ORAL_CAPSULE | Freq: Two times a day (BID) | ORAL | Status: DC | PRN

## 2019-02-25 MED ORDER — METOPROLOL TARTRATE 100 MG PO TABS
100.0000 mg | ORAL_TABLET | Freq: Two times a day (BID) | ORAL | Status: DC
  Administered 2019-02-25: 100 mg via ORAL
  Filled 2019-02-25: qty 1

## 2019-02-25 MED ORDER — BISACODYL 5 MG PO TBEC: 10.0000 mg | DELAYED_RELEASE_TABLET | Freq: Every day | ORAL | Status: DC | PRN

## 2019-02-25 MED ORDER — ASPIRIN 81 MG PO TBEC: 81.0000 mg | DELAYED_RELEASE_TABLET | Freq: Every day | ORAL | Status: AC

## 2019-02-25 MED ORDER — OXYCODONE HCL 5 MG PO TABS: 5.0000 mg | ORAL_TABLET | ORAL | 0 refills | Status: DC | PRN

## 2019-02-25 MED ORDER — WHITE PETROLATUM EX OINT
TOPICAL_OINTMENT | CUTANEOUS | Status: AC
  Filled 2019-02-25: qty 28.35
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
## 2019-02-25 NOTE — Progress Notes (Signed)
Pt for DC to home by dtr.  Rx are ready at [place].  Girlfriend will be at home with pt for assistance with care.  All follow up appointments reviewed with pt.
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
## 2019-02-25 NOTE — Discharge Instructions (Signed)
Remember to establish care with a primary care provider to help with overall coordination of care from this point forward. I recommend calling your insurance company and getting a list of primary provider's near you so that you can find a provider covered by your insurance.   Clavicle Fracture  A clavicle fracture is a broken collarbone. The collarbone is the long bone that connects your shoulder to your chest wall. A broken collarbone may be treated with a sling or with surgery. Treatment depends on whether the broken ends of the bone are out of place or not. Follow these instructions at home: If you have a sling:  Wear the sling as told by your doctor. Take it off only as told by your doctor.  Loosen the sling if your fingers tingle, become numb, or turn cold and blue.  Do not lift your arm. Keep it across your chest.  Keep the sling clean.  Ask your doctor if you may take off the sling for bathing. ? If your sling is not waterproof, do not let it get wet. Cover the sling with a watertight covering if you take a bath or a shower while wearing it. ? If you may take off your sling when you take a bath or a shower, keep your shoulder in the same position as when the sling is on. Managing pain, stiffness, and swelling   If told, put ice on the injured area: ? If you have a removable sling, take it off as told by your doctor. ? Put ice in a plastic bag. ? Place a towel between your skin and the bag. ? Leave the ice on for 20 minutes, 2-3 times a day. Activity  Avoid activities that make your symptoms worse for 4-6 weeks, or as long as told.  Ask your doctor when it is safe for you to drive.  Do exercises as told by your doctor. General instructions  Do not use any products that contain nicotine or tobacco, such as cigarettes and e-cigarettes. These can delay bone healing. If you need help quitting, ask your doctor.  Take over-the-counter and prescription medicines only as told by  your doctor.  Keep all follow-up visits as told by your doctor. This is important. Contact a doctor if:  Your medicine is not making you feel less pain.  Your medicine is not making swelling better. Get help right away if:  Your cannot feel your arm (your arm is numb).  Your arm is cold.  Your arm is a lighter color than normal. Summary  A clavicle fracture is a broken collarbone. The collarbone is the long bone that connects your shoulder to your chest wall.  Treatment depends on whether the broken ends of the bone are out of place or not.  If you have a sling, wear it as told by your doctor.  Do exercises when your doctor says you can. The exercises will help your arm get strong and move like it used to. This information is not intended to replace advice given to you by your health care provider. Make sure you discuss any questions you have with your health care provider. Document Released: 02/22/2008 Document Revised: 07/25/2016 Document Reviewed: 07/25/2016 Elsevier Interactive Patient Education  2019 [employer] Inc[employer]   Rib Fracture  A rib fracture is a break or crack in one of the bones of the ribs. The ribs are like a cage that goes around your upper chest. A broken or cracked rib is often painful, but  most do not cause other problems. Most rib fractures usually heal on their own in 1-3 months. Follow these instructions at home: Managing pain, stiffness, and swelling  If directed, apply ice to the injured area. ? Put ice in a plastic bag. ? Place a towel between your skin and the bag. ? Leave the ice on for 20 minutes, 2-3 times a day.  Take over-the-counter and prescription medicines only as told by your doctor. Activity  Avoid activities that cause pain to the injured area. Protect your injured area.  Slowly increase activity as told by your doctor. General instructions  Do deep breathing as told by your doctor. You may be told to: ? Take deep breaths many times  a day. ? Cough many times a day while hugging a pillow. ? Use a device (incentive spirometer) to do deep breathing many times a day.  Drink enough fluid to keep your pee (urine) clear or pale yellow.  Do not wear a rib belt or binder. These do not allow you to breathe deeply.  Keep all follow-up visits as told by your doctor. This is important. Contact a doctor if:  You have a fever. Get help right away if:  You have trouble breathing.  You are short of breath.  You cannot stop coughing.  You cough up thick or bloody spit (sputum).  You feel sick to your stomach (nauseous), throw up (vomit), or have belly (abdominal) pain.  Your pain gets worse and medicine does not help. Summary  A rib fracture is a break or crack in one of the bones of the ribs.  Apply ice to the injured area and take medicines for pain as told by your doctor.  Take deep breaths and cough many times a day. Hug a pillow every time you cough. This information is not intended to replace advice given to you by your health care provider. Make sure you discuss any questions you have with your health care provider. Document Released: 06/14/2008 Document Revised: 12/06/2016 Document Reviewed: 12/06/2016 Elsevier Interactive Patient Education  2019 [employer].
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
## 2019-02-26 ENCOUNTER — Encounter (HOSPITAL_COMMUNITY): Payer: Self-pay | Admitting: *Deleted

## 2019-03-01 ENCOUNTER — Telehealth: Payer: Self-pay
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
## 2019-03-04 ENCOUNTER — Encounter: Payer: Self-pay | Admitting: Family Medicine

## 2019-03-04 ENCOUNTER — Ambulatory Visit: Payer: BC Managed Care – PPO | Admitting: Family Medicine

## 2019-03-04 VITALS — BP 150/86 | HR 99 | Temp 97.8°F | Resp 20 | Ht 72.0 in | Wt 209.0 lb

## 2019-03-04 DIAGNOSIS — F1010 Alcohol abuse, uncomplicated: Secondary | ICD-10-CM

## 2019-03-04 DIAGNOSIS — N179 Acute kidney failure, unspecified: Secondary | ICD-10-CM

## 2019-03-04 DIAGNOSIS — S42021A Displaced fracture of shaft of right clavicle, initial encounter for closed fracture: Secondary | ICD-10-CM | POA: Diagnosis not present

## 2019-03-04 DIAGNOSIS — D62 Acute posthemorrhagic anemia: Secondary | ICD-10-CM

## 2019-03-04 DIAGNOSIS — S2500XA Unspecified injury of thoracic aorta, initial encounter: Secondary | ICD-10-CM

## 2019-03-04 DIAGNOSIS — S2241XA Multiple fractures of ribs, right side, initial encounter for closed fracture: Secondary | ICD-10-CM
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
## 2019-03-05 ENCOUNTER — Other Ambulatory Visit (HOSPITAL_COMMUNITY)
Admission: RE | Admit: 2019-03-05 | Discharge: 2019-03-05 | Disposition: A | Discharge status: Home / Self Care | Payer: BC Managed Care – PPO | Source: Ambulatory Visit | Attending: Orthopaedic Surgery | Admitting: Orthopaedic Surgery

## 2019-03-05 ENCOUNTER — Encounter (HOSPITAL_COMMUNITY): Payer: Self-pay

## 2019-03-05 DIAGNOSIS — Z1159 Encounter for screening for other viral diseases: Secondary | ICD-10-CM | POA: Diagnosis not present

## 2019-03-05 LAB — SARS CORONAVIRUS 2 BY RT PCR (HOSPITAL ORDER, PERFORMED IN ~~LOC~~ HOSPITAL LAB): SARS Coronavirus 2: NEGATIVE

## 2019-03-05 NOTE — Progress Notes (Signed)
SPOKE W/  _stephen Kraszewski Surg.time also confirmed     SCREENING SYMPTOMS OF COVID 19: No new symptoms. NO to all below.  COUGH--  RUNNY NOSE---   SORE THROAT---  NASAL CONGESTION----  SNEEZING----  SHORTNESS OF BREATH---  DIFFICULTY BREATHING---  TEMP >100.0 -----  UNEXPLAINED BODY ACHES------  CHILLS --------   HEADACHES ---------  LOSS OF SMELL/ TASTE --------    HAVE YOU OR ANY FAMILY MEMBER TRAVELLED PAST 14 DAYS OUT OF THE   COUNTY---no STATE----no COUNTRY----no  HAVE YOU OR ANY FAMILY MEMBER BEEN EXPOSED TO ANYONE WITH COVID 19? no

## 2019-03-05 NOTE — Anesthesia Preprocedure Evaluation (Addendum)
Anesthesia Evaluation  Patient identified by MRN, date of birth, ID band Patient awake    Reviewed: Allergy & Precautions, H&P , NPO status , Patient's Chart, lab work & pertinent test results  Airway Mallampati: III  TM Distance: >3 FB Neck ROM: Full    Dental no notable dental hx. (+) Teeth Intact, Dental Advisory Given   Pulmonary neg pulmonary ROS,    Pulmonary exam normal breath sounds clear to auscultation       Cardiovascular negative cardio ROS   Rhythm:Regular Rate:Normal     Neuro/Psych negative neurological ROS  negative psych ROS   GI/Hepatic negative GI ROS, Neg liver ROS,   Endo/Other  negative endocrine ROS  Renal/GU negative Renal ROS  negative genitourinary   Musculoskeletal   Abdominal   Peds  Hematology negative hematology ROS (+)   Anesthesia Other Findings   Reproductive/Obstetrics negative OB ROS                            Anesthesia Physical Anesthesia Plan  ASA: I  Anesthesia Plan: General   Post-op Pain Management:    Induction: Intravenous  PONV Risk Score and Plan: 3 and Ondansetron, Dexamethasone and Midazolam  Airway Management Planned: Oral ETT  Additional Equipment:   Intra-op Plan:   Post-operative Plan: Extubation in OR  Informed Consent: I have reviewed the patients History and Physical, chart, labs and discussed the procedure including the risks, benefits and alternatives for the proposed anesthesia with the patient or authorized representative who has indicated his/her understanding and acceptance.     Dental advisory given  Plan Discussed with: CRNA  Anesthesia Plan Comments: (See PAT note 03/05/2019, Konrad Felix, PA-C)       Anesthesia Quick Evaluation

## 2019-03-05 NOTE — Progress Notes (Addendum)
SPOKE W/  Deegan     SCREENING SYMPTOMS OF COVID 19:   COUGH--NO  RUNNY NOSE--- NO  SORE THROAT---NO  NASAL CONGESTION----NO  SNEEZING----NO  SHORTNESS OF BREATH---NO  DIFFICULTY BREATHING---NO  TEMP >100.0 -----NO  UNEXPLAINED BODY ACHES------NO  CHILLS -------- NO  HEADACHES ---------NO  LOSS OF SMELL/ TASTE --------NO    HAVE YOU OR ANY FAMILY MEMBER TRAVELLED PAST 14 DAYS OUT OF THE   COUNTY---NO STATE----NO COUNTRY----NO  HAVE YOU OR ANY FAMILY MEMBER BEEN EXPOSED TO ANYONE WITH COVID 19? NO    
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
## 2019-03-06 ENCOUNTER — Ambulatory Visit (HOSPITAL_COMMUNITY): Payer: BC Managed Care – PPO | Admitting: Physician Assistant

## 2019-03-06 ENCOUNTER — Ambulatory Visit (HOSPITAL_COMMUNITY): Payer: BC Managed Care – PPO

## 2019-03-06 ENCOUNTER — Encounter (HOSPITAL_COMMUNITY): Payer: Self-pay | Admitting: Emergency Medicine

## 2019-03-06 ENCOUNTER — Other Ambulatory Visit: Payer: Self-pay

## 2019-03-06 ENCOUNTER — Ambulatory Visit (HOSPITAL_COMMUNITY)
Admission: RE | Admit: 2019-03-06 | Discharge: 2019-03-06 | Disposition: A | Discharge status: Home / Self Care | Payer: BC Managed Care – PPO | Attending: Orthopaedic Surgery | Admitting: Orthopaedic Surgery

## 2019-03-06 ENCOUNTER — Encounter (HOSPITAL_COMMUNITY)
Admission: RE | Disposition: A | Discharge status: Home / Self Care | Payer: Self-pay | Source: Home / Self Care | Attending: Orthopaedic Surgery

## 2019-03-06 DIAGNOSIS — Z955 Presence of coronary angioplasty implant and graft: Secondary | ICD-10-CM | POA: Insufficient documentation

## 2019-03-06 DIAGNOSIS — R52 Pain, unspecified: Secondary | ICD-10-CM

## 2019-03-06 DIAGNOSIS — S42001A Fracture of unspecified part of right clavicle, initial encounter for closed fracture: Secondary | ICD-10-CM | POA: Diagnosis present

## 2019-03-06 DIAGNOSIS — X58XXXA Exposure to other specified factors, initial encounter: Secondary | ICD-10-CM | POA: Insufficient documentation

## 2019-03-06 HISTORY — PX: ORIF CLAVICULAR FRACTURE: SHX5055

## 2019-03-06 LAB — BASIC METABOLIC PANEL
Anion gap: 14 (ref 5–15)
BUN: 13 mg/dL (ref 6–20)
CO2: 23 mmol/L (ref 22–32)
Calcium: 9.7 mg/dL (ref 8.9–10.3)
Chloride: 102 mmol/L (ref 98–111)
Creatinine, Ser: 0.8 mg/dL (ref 0.61–1.24)
GFR calc Af Amer: >60 mL/min (ref >60)
GFR calc non Af Amer: >60 mL/min (ref >60)
Glucose, Bld: 121 mg/dL — ABNORMAL HIGH (ref 70–99)
Potassium: 4 mmol/L (ref 3.5–5.1)
Sodium: 139 mmol/L (ref 135–145)

## 2019-03-06 LAB — CBC
HCT: 39.3 % (ref 39.0–52.0)
Hemoglobin: 13.5 g/dL (ref 13.0–17.0)
MCH: 33.4 pg (ref 26.0–34.0)
MCHC: 34.4 g/dL (ref 30.0–36.0)
MCV: 97.3 fL (ref 80.0–100.0)
Platelets: 772 10*3/uL — ABNORMAL HIGH (ref 150–400)
RBC: 4.04 MIL/uL — ABNORMAL LOW (ref 4.22–5.81)
RDW: 12.3 % (ref 11.5–15.5)
WBC: 10.5 10*3/uL (ref 4.0–10.5)
nRBC: 0 % (ref 0.0–0.2)

## 2019-03-06 SURGERY — OPEN REDUCTION INTERNAL FIXATION (ORIF) CLAVICULAR FRACTURE
Anesthesia: General | Laterality: Right

## 2019-03-06 MED ORDER — DEXAMETHASONE SODIUM PHOSPHATE 10 MG/ML IJ SOLN
INTRAMUSCULAR | Status: DC | PRN
  Administered 2019-03-06: 4 mg via INTRAVENOUS

## 2019-03-06 MED ORDER — PHENYLEPHRINE 40 MCG/ML (10ML) SYRINGE FOR IV PUSH (FOR BLOOD PRESSURE SUPPORT)
PREFILLED_SYRINGE | INTRAVENOUS | Status: AC
  Filled 2019-03-06: qty 10

## 2019-03-06 MED ORDER — CHLORHEXIDINE GLUCONATE 4 % EX LIQD: 60.0000 mL | Freq: Once | CUTANEOUS | Status: DC

## 2019-03-06 MED ORDER — ACETAMINOPHEN 500 MG PO TABS
1000.0000 mg | ORAL_TABLET | Freq: Once | ORAL | Status: AC
  Administered 2019-03-06: 1000 mg via ORAL
  Filled 2019-03-06: qty 2

## 2019-03-06 MED ORDER — BUPIVACAINE-EPINEPHRINE 0.25% -1:200000 IJ SOLN
INTRAMUSCULAR | Status: DC | PRN
  Administered 2019-03-06: 20 mL

## 2019-03-06 MED ORDER — MIDAZOLAM HCL 2 MG/2ML IJ SOLN
INTRAMUSCULAR | Status: AC
  Filled 2019-03-06: qty 2

## 2019-03-06 MED ORDER — SUCCINYLCHOLINE CHLORIDE 200 MG/10ML IV SOSY
PREFILLED_SYRINGE | INTRAVENOUS | Status: DC | PRN
  Administered 2019-03-06: 100 mg via INTRAVENOUS

## 2019-03-06 MED ORDER — FENTANYL CITRATE (PF) 250 MCG/5ML IJ SOLN
INTRAMUSCULAR | Status: DC | PRN
  Administered 2019-03-06: 100 ug via INTRAVENOUS
  Administered 2019-03-06 (×4): 50 ug via INTRAVENOUS

## 2019-03-06 MED ORDER — OXYCODONE HCL 5 MG PO TABS: ORAL_TABLET | ORAL | 0 refills | Status: AC

## 2019-03-06 MED ORDER — ROCURONIUM BROMIDE 10 MG/ML (PF) SYRINGE
PREFILLED_SYRINGE | INTRAVENOUS | Status: DC | PRN
  Administered 2019-03-06 (×2): 20 mg via INTRAVENOUS
  Administered 2019-03-06: 40 mg via INTRAVENOUS
  Administered 2019-03-06: 20 mg via INTRAVENOUS

## 2019-03-06 MED ORDER — HYDROMORPHONE HCL 1 MG/ML IJ SOLN
INTRAMUSCULAR | Status: AC
  Filled 2019-03-06: qty 2

## 2019-03-06 MED ORDER — SUCCINYLCHOLINE CHLORIDE 200 MG/10ML IV SOSY
PREFILLED_SYRINGE | INTRAVENOUS | Status: AC
  Filled 2019-03-06: qty 10

## 2019-03-06 MED ORDER — PHENYLEPHRINE 40 MCG/ML (10ML) SYRINGE FOR IV PUSH (FOR BLOOD PRESSURE SUPPORT)
PREFILLED_SYRINGE | INTRAVENOUS | Status: DC | PRN
  Administered 2019-03-06 (×3): 120 ug via INTRAVENOUS
  Administered 2019-03-06: 80 ug via INTRAVENOUS

## 2019-03-06 MED ORDER — LACTATED RINGERS IV SOLN
INTRAVENOUS | Status: DC
  Administered 2019-03-06: 14:00:00 via INTRAVENOUS

## 2019-03-06 MED ORDER — CEFAZOLIN SODIUM-DEXTROSE 2-4 GM/100ML-% IV SOLN
2.0000 g | INTRAVENOUS | Status: AC
  Administered 2019-03-06: 2 g via INTRAVENOUS
  Filled 2019-03-06: qty 100

## 2019-03-06 MED ORDER — FENTANYL CITRATE (PF) 100 MCG/2ML IJ SOLN
INTRAMUSCULAR | Status: AC
  Filled 2019-03-06: qty 2

## 2019-03-06 MED ORDER — MIDAZOLAM HCL 2 MG/2ML IJ SOLN
INTRAMUSCULAR | Status: DC | PRN
  Administered 2019-03-06: 2 mg via INTRAVENOUS

## 2019-03-06 MED ORDER — HYDROMORPHONE HCL 1 MG/ML IJ SOLN
INTRAMUSCULAR | Status: AC
  Filled 2019-03-06: qty 1

## 2019-03-06 MED ORDER — SUGAMMADEX SODIUM 200 MG/2ML IV SOLN
INTRAVENOUS | Status: AC
  Filled 2019-03-06: qty 2

## 2019-03-06 MED ORDER — SUGAMMADEX SODIUM 500 MG/5ML IV SOLN
INTRAVENOUS | Status: DC | PRN
  Administered 2019-03-06: 200 mg via INTRAVENOUS

## 2019-03-06 MED ORDER — VANCOMYCIN HCL 1000 MG IV SOLR
INTRAVENOUS | Status: AC
  Filled 2019-03-06: qty 1000

## 2019-03-06 MED ORDER — BUPIVACAINE HCL (PF) 0.25 % IJ SOLN: INTRAMUSCULAR | Status: DC | PRN

## 2019-03-06 MED ORDER — METHOCARBAMOL 500 MG IVPB - SIMPLE MED
500.0000 mg | Freq: Once | INTRAVENOUS | Status: AC
  Administered 2019-03-06: 500 mg via INTRAVENOUS

## 2019-03-06 MED ORDER — PROPOFOL 10 MG/ML IV BOLUS
INTRAVENOUS | Status: DC | PRN
  Administered 2019-03-06: 130 mg via INTRAVENOUS

## 2019-03-06 MED ORDER — LIDOCAINE 2% (20 MG/ML) 5 ML SYRINGE
INTRAMUSCULAR | Status: DC | PRN
  Administered 2019-03-06: 60 mg via INTRAVENOUS

## 2019-03-06 MED ORDER — METHOCARBAMOL 500 MG IVPB - SIMPLE MED
INTRAVENOUS | Status: AC
  Filled 2019-03-06: qty 50

## 2019-03-06 MED ORDER — HYDROMORPHONE HCL 1 MG/ML IJ SOLN
0.2500 mg | INTRAMUSCULAR | Status: DC | PRN
  Administered 2019-03-06 (×4): 0.5 mg via INTRAVENOUS

## 2019-03-06 MED ORDER — HYDROMORPHONE HCL 1 MG/ML IJ SOLN
0.2500 mg | INTRAMUSCULAR | Status: DC | PRN
  Administered 2019-03-06 (×2): 0.5 mg via INTRAVENOUS

## 2019-03-06 MED ORDER — ACETAMINOPHEN 500 MG PO TABS: 1000.0000 mg | ORAL_TABLET | Freq: Three times a day (TID) | ORAL | 0 refills | Status: AC

## 2019-03-06 MED ORDER — DEXAMETHASONE SODIUM PHOSPHATE 10 MG/ML IJ SOLN
INTRAMUSCULAR | Status: AC
  Filled 2019-03-06: qty 1

## 2019-03-06 MED ORDER — LIDOCAINE 2% (20 MG/ML) 5 ML SYRINGE
INTRAMUSCULAR | Status: AC
  Filled 2019-03-06: qty 5

## 2019-03-06 MED ORDER — ONDANSETRON HCL 4 MG PO TABS: 4.0000 mg | ORAL_TABLET | Freq: Three times a day (TID) | ORAL | 1 refills | Status: AC | PRN

## 2019-03-06 MED ORDER — SUGAMMADEX SODIUM 500 MG/5ML IV SOLN
INTRAVENOUS | Status: AC
  Filled 2019-03-06: qty 5

## 2019-03-06 MED ORDER — MELOXICAM 7.5 MG PO TABS: 7.5000 mg | ORAL_TABLET | Freq: Every day | ORAL | 2 refills | Status: AC

## 2019-03-06 MED ORDER — ROCURONIUM BROMIDE 10 MG/ML (PF) SYRINGE
PREFILLED_SYRINGE | INTRAVENOUS | Status: AC
  Filled 2019-03-06: qty 10

## 2019-03-06 MED ORDER — ONDANSETRON HCL 4 MG/2ML IJ SOLN
INTRAMUSCULAR | Status: AC
  Filled 2019-03-06: qty 2

## 2019-03-06 MED ORDER — ONDANSETRON HCL 4 MG/2ML IJ SOLN
INTRAMUSCULAR | Status: DC | PRN
  Administered 2019-03-06: 4 mg via INTRAVENOUS

## 2019-03-06 MED ORDER — VANCOMYCIN HCL 1000 MG IV SOLR
INTRAVENOUS | Status: DC | PRN
  Administered 2019-03-06: 1000 mg

## 2019-03-06 SURGICAL SUPPLY — 64 items
BENZOIN TINCTURE PRP APPL 2/3 (GAUZE/BANDAGES/DRESSINGS) IMPLANT
BIT DRILL 2 CANN GRADUATED (BIT) ×3 IMPLANT
BIT DRILL 2.7 (BIT) ×2
BIT DRILL 2.7X2.7/3XSCR ANKL (BIT) ×1 IMPLANT
BIT DRL 2.7X2.7/3XSCR ANKL (BIT) ×1
BLADE HEX COATED 2.75 (ELECTRODE) IMPLANT
BLADE SURG 15 STRL LF DISP TIS (BLADE) ×1 IMPLANT
BLADE SURG 15 STRL SS (BLADE) ×2
BLADE SURG SZ10 CARB STEEL (BLADE) IMPLANT
BNDG COHESIVE 4X5 TAN STRL (GAUZE/BANDAGES/DRESSINGS) ×3 IMPLANT
CHLORAPREP W/TINT 26 (MISCELLANEOUS) ×3 IMPLANT
CLOSURE WOUND 1/2 X4 (GAUZE/BANDAGES/DRESSINGS) ×1
COVER WAND RF STERILE (DRAPES) IMPLANT
DECANTER SPIKE VIAL GLASS SM (MISCELLANEOUS) ×3 IMPLANT
DRAPE C-ARM 42X120 X-RAY (DRAPES) ×3 IMPLANT
DRAPE IMP U-DRAPE 54X76 (DRAPES) ×3 IMPLANT
DRAPE INCISE IOBAN 66X45 STRL (DRAPES) ×3 IMPLANT
DRAPE ORTHO SPLIT 77X108 STRL (DRAPES) ×2
DRAPE SHEET LG 3/4 BI-LAMINATE (DRAPES) ×3 IMPLANT
DRAPE SURG ORHT 6 SPLT 77X108 (DRAPES) ×1 IMPLANT
DRSG AQUACEL AG ADV 3.5X 6 (GAUZE/BANDAGES/DRESSINGS) ×3 IMPLANT
DRSG AQUACEL AG ADV 3.5X10 (GAUZE/BANDAGES/DRESSINGS) ×3 IMPLANT
ELECT PENCIL ROCKER SW 15FT (MISCELLANEOUS) ×3 IMPLANT
ELECT REM PT RETURN 9FT ADLT (ELECTROSURGICAL) ×3
ELECTRODE REM PT RTRN 9FT ADLT (ELECTROSURGICAL) ×1 IMPLANT
GAUZE XEROFORM 1X8 LF (GAUZE/BANDAGES/DRESSINGS) ×6 IMPLANT
GLOVE BIO SURGEON STRL SZ8 (GLOVE) ×6 IMPLANT
GLOVE BIOGEL PI IND STRL 8 (GLOVE) ×2 IMPLANT
GLOVE BIOGEL PI INDICATOR 8 (GLOVE) ×4
GLOVE ECLIPSE 8.0 STRL XLNG CF (GLOVE) ×6 IMPLANT
GOWN STRL REUS W/ TWL LRG LVL3 (GOWN DISPOSABLE) ×1 IMPLANT
GOWN STRL REUS W/TWL LRG LVL3 (GOWN DISPOSABLE) ×2
GOWN STRL REUS W/TWL XL LVL3 (GOWN DISPOSABLE) ×3 IMPLANT
KIT BASIN OR (CUSTOM PROCEDURE TRAY) ×3 IMPLANT
KIT STABILIZATION SHOULDER (MISCELLANEOUS) ×3 IMPLANT
PACK SHOULDER (CUSTOM PROCEDURE TRAY) ×3 IMPLANT
PLATE DISTAL CLAVICLE 10H RT (Plate) ×3 IMPLANT
RESTRAINT HEAD UNIVERSAL NS (MISCELLANEOUS) ×3 IMPLANT
SCREW BN T10 FT 20X2.7XST CORT (Screw) ×1 IMPLANT
SCREW CORT 2.7X20 (Screw) ×2 IMPLANT
SCREW CORTEX LP TM SS 2.7X16 (Screw) ×3 IMPLANT
SCREW LOCK T10 FT 18X2.7X (Screw) ×3 IMPLANT
SCREW LOCKING 2.7X18MM (Screw) ×6 IMPLANT
SCREW LOW PROFILE 3.5X16 (Screw) ×3 IMPLANT
SCREW NLOCK T15 FT 18X3.5XST (Screw) ×2 IMPLANT
SCREW NON LOCK 3.5X18MM (Screw) ×4 IMPLANT
SCREW NON-LOCKING 3.5X20 ANKLE (Screw) ×6 IMPLANT
SLEEVE SCD COMPRESS KNEE MED (MISCELLANEOUS) ×3 IMPLANT
SLING ARM FOAM STRAP MED (SOFTGOODS) IMPLANT
SLING ARM FOAM STRAP SML (SOFTGOODS) ×3 IMPLANT
SLING ARM FOAM STRAP XLG (SOFTGOODS) IMPLANT
SPONGE LAP 18X18 RF (DISPOSABLE) ×3 IMPLANT
SPONGE LAP 4X18 RFD (DISPOSABLE) IMPLANT
STRIP CLOSURE SKIN 1/2X4 (GAUZE/BANDAGES/DRESSINGS) ×2 IMPLANT
SUCTION FRAZIER HANDLE 10FR (MISCELLANEOUS) ×2
SUCTION TUBE FRAZIER 10FR DISP (MISCELLANEOUS) ×1 IMPLANT
SUT MNCRL AB 4-0 PS2 18 (SUTURE) IMPLANT
SUT VIC AB 0 CT1 18XCR BRD 8 (SUTURE) ×1 IMPLANT
SUT VIC AB 0 CT1 8-18 (SUTURE) ×2
SUT VIC AB 3-0 SH 27 (SUTURE) ×2
SUT VIC AB 3-0 SH 27X BRD (SUTURE) ×1 IMPLANT
SYR BULB 3OZ (MISCELLANEOUS) ×3 IMPLANT
TOWEL OR 17X26 10 PK STRL BLUE (TOWEL DISPOSABLE) ×3 IMPLANT
YANKAUER SUCT BULB TIP NO VENT (SUCTIONS) ×3 IMPLANT

## 2019-03-06 NOTE — Transfer of Care (Signed)
Immediate Anesthesia Transfer of Care Note  Patient: Randall Cherry  Procedure(s) Performed: OPEN REDUCTION INTERNAL FIXATION (ORIF) RIGHT CLAVICULAR FRACTURE (Right )  Patient Location: PACU  Anesthesia Type:General  Level of Consciousness: awake, alert , oriented and patient cooperative  Airway & Oxygen Therapy: Patient Spontanous Breathing and Patient connected to face mask oxygen  Post-op Assessment: Report given to RN and Post -op Vital signs reviewed and stable  Post vital signs: Reviewed and stable  Last Vitals:  Vitals Value Taken Time  BP 158/91 03/06/19 1738  Temp    Pulse 92 03/06/19 1741  Resp 10 03/06/19 1741  SpO2 100 % 03/06/19 1741  Vitals shown include unvalidated device data.  Last Pain:  Vitals:   03/06/19 1429  TempSrc: Oral  PainSc:       Patients Stated Pain Goal: 4 (32/67/12 4580)  Complications: No apparent anesthesia complications
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
## 2019-03-06 NOTE — Anesthesia Postprocedure Evaluation (Signed)
Anesthesia Post Note  Patient: Randall Cherry  Procedure(s) Performed: OPEN REDUCTION INTERNAL FIXATION (ORIF) RIGHT CLAVICULAR FRACTURE (Right )     Patient location during evaluation: PACU Anesthesia Type: General Level of consciousness: awake and alert Pain management: pain level controlled Vital Signs Assessment: post-procedure vital signs reviewed and stable Respiratory status: spontaneous breathing, nonlabored ventilation and respiratory function stable Cardiovascular status: blood pressure returned to baseline and stable Postop Assessment: no apparent nausea or vomiting Anesthetic complications: no    Last Vitals:  Vitals:   03/06/19 1800 03/06/19 1815  BP: (!) 149/95 135/90  Pulse: 88 92  Resp: (!) 9 14  Temp:    SpO2: 100% 94%    Last Pain:  Vitals:   03/06/19 1815  TempSrc:   PainSc: 7                  Kit Mollett,W. EDMOND
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
## 2019-03-06 NOTE — Anesthesia Procedure Notes (Signed)
Procedure Name: Intubation Date/Time: 03/06/2019 3:28 PM Performed by: Niel Hummer, CRNA Pre-anesthesia Checklist: Patient being monitored, Suction available, Emergency Drugs available and Patient identified Patient Re-evaluated:Patient Re-evaluated prior to induction Oxygen Delivery Method: Circle system utilized Preoxygenation: Pre-oxygenation with 100% oxygen Induction Type: IV induction and Rapid sequence Laryngoscope Size: Mac and 4 Grade View: Grade I Tube type: Oral Tube size: 7.5 mm Number of attempts: 1 Airway Equipment and Method: Stylet Placement Confirmation: ETT inserted through vocal cords under direct vision,  positive ETCO2 and breath sounds checked- equal and bilateral Secured at: 22 cm Tube secured with: Tape Dental Injury: Teeth and Oropharynx as per pre-operative assessment

## 2019-03-08 ENCOUNTER — Ambulatory Visit (HOSPITAL_COMMUNITY): Payer: Self-pay

## 2019-03-08 ENCOUNTER — Encounter (HOSPITAL_COMMUNITY): Payer: Self-pay | Admitting: Orthopaedic Surgery

## 2019-03-08 DIAGNOSIS — F1010 Alcohol abuse, uncomplicated: Secondary | ICD-10-CM

## 2019-03-08 DIAGNOSIS — S2500XA Unspecified injury of thoracic aorta, initial encounter: Secondary | ICD-10-CM | POA: Insufficient documentation

## 2019-03-08 DIAGNOSIS — S2239XA Fracture of one rib, unspecified side, initial encounter for closed fracture: Secondary | ICD-10-CM | POA: Insufficient documentation

## 2019-03-08 DIAGNOSIS — S42009A Fracture of unspecified part of unspecified clavicle, initial encounter for closed fracture: Secondary | ICD-10-CM | POA: Insufficient documentation

## 2019-03-08 HISTORY — DX: Alcohol abuse, uncomplicated: F10.10
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
## 2019-03-26 ENCOUNTER — Ambulatory Visit
Admission: RE | Admit: 2019-03-26 | Discharge: 2019-03-26 | Disposition: A | Discharge status: Home / Self Care | Payer: BC Managed Care – PPO | Source: Ambulatory Visit | Attending: Vascular Surgery | Admitting: Vascular Surgery

## 2019-03-26 MED ORDER — IOPAMIDOL (ISOVUE-370) INJECTION 76%
75.0000 mL | Freq: Once | INTRAVENOUS | Status: AC | PRN
  Administered 2019-03-26: 125 mL via INTRAVENOUS

## 2019-04-01 ENCOUNTER — Telehealth (HOSPITAL_COMMUNITY): Payer: Self-pay | Admitting: Rehabilitation

## 2019-04-01 NOTE — Telephone Encounter (Signed)

## 2019-04-02 ENCOUNTER — Encounter: Payer: Self-pay | Admitting: Vascular Surgery

## 2019-04-02 ENCOUNTER — Ambulatory Visit (INDEPENDENT_AMBULATORY_CARE_PROVIDER_SITE_OTHER): Payer: Self-pay | Admitting: Vascular Surgery

## 2019-04-02 ENCOUNTER — Other Ambulatory Visit: Payer: Self-pay

## 2019-04-02 VITALS — BP 134/87 | HR 96 | Temp 97.6°F | Resp 20 | Ht 73.0 in | Wt 198.0 lb

## 2019-04-02 DIAGNOSIS — S2500XA Unspecified injury of thoracic aorta, initial encounter: Secondary | ICD-10-CM
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
## 2019-04-04 ENCOUNTER — Telehealth: Payer: Self-pay | Admitting: Family Medicine

## 2019-04-04 NOTE — Telephone Encounter (Signed)
Called Pt . Made MyChart VV for 04/05/2019.

## 2019-04-04 NOTE — Telephone Encounter (Signed)
Pt called and said he has a spot on his right hip where he had pulled off a tick and now its a little swollen and red, he said this has happened in the past and was prescribed some ointment to put on it and he wanted to know if he could just do virtual or would he have to come in

## 2019-04-05 ENCOUNTER — Encounter: Payer: Self-pay | Admitting: Family Medicine

## 2019-04-05 ENCOUNTER — Telehealth (INDEPENDENT_AMBULATORY_CARE_PROVIDER_SITE_OTHER): Payer: BC Managed Care – PPO | Admitting: Family Medicine

## 2019-04-05 DIAGNOSIS — W57XXXA Bitten or stung by nonvenomous insect and other nonvenomous arthropods, initial encounter: Secondary | ICD-10-CM | POA: Diagnosis not present

## 2019-04-05 DIAGNOSIS — S70261A Insect bite (nonvenomous), right hip, initial encounter: Secondary | ICD-10-CM

## 2019-04-05 MED ORDER — DOXYCYCLINE HYCLATE 100 MG PO TABS: 200.0000 mg | ORAL_TABLET | Freq: Once | ORAL | 0 refills | Status: AC
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
## 2019-04-05 NOTE — Assessment & Plan Note (Addendum)
-  Discussed that it is not uncommon to have a localized reaction to tick bite similar to what he has on hip.  This should resolve within a few days.  -I will provide doxycycline though, given level of engorgement for prophylaxis.   -Discussed signs and symptoms to look out for including fever, chills, abdominal pain/nausea or rash.

## 2019-04-17 DIAGNOSIS — Z0279 Encounter for issue of other medical certificate: Secondary | ICD-10-CM

## 2020-03-11 ENCOUNTER — Other Ambulatory Visit: Payer: Self-pay

## 2020-03-11 DIAGNOSIS — I712 Thoracic aortic aneurysm, without rupture, unspecified: Secondary | ICD-10-CM

## 2020-04-03 ENCOUNTER — Ambulatory Visit
Admission: RE | Admit: 2020-04-03 | Discharge: 2020-04-03 | Disposition: A | Discharge status: Home / Self Care | Payer: BC Managed Care – PPO | Source: Ambulatory Visit | Attending: Vascular Surgery | Admitting: Vascular Surgery

## 2020-04-03 DIAGNOSIS — I712 Thoracic aortic aneurysm, without rupture, unspecified: Secondary | ICD-10-CM

## 2020-04-03 MED ORDER — IOPAMIDOL (ISOVUE-370) INJECTION 76%
75.0000 mL | Freq: Once | INTRAVENOUS | Status: AC | PRN
  Administered 2020-04-03: 75 mL via INTRAVENOUS

## 2020-04-07 ENCOUNTER — Encounter: Payer: Self-pay | Admitting: Vascular Surgery

## 2020-04-07 ENCOUNTER — Other Ambulatory Visit: Payer: Self-pay

## 2020-04-07 ENCOUNTER — Ambulatory Visit: Payer: BC Managed Care – PPO | Admitting: Vascular Surgery

## 2020-04-07 VITALS — BP 145/85 | HR 62 | Temp 97.5°F | Resp 16 | Ht 73.0 in | Wt 205.0 lb

## 2020-04-07 DIAGNOSIS — S2500XA Unspecified injury of thoracic aorta, initial encounter: Secondary | ICD-10-CM | POA: Diagnosis not present
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
## 2020-10-14 IMAGING — CT CT HEAD WITHOUT CONTRAST
4 of 12 series · 13 of 47 positions shown, 15 images · non-contrast
Comparison: None

CLINICAL DATA: Head trauma, ataxia

EXAM:
CT HEAD WITHOUT CONTRAST
CT CERVICAL SPINE WITHOUT CONTRAST
TECHNIQUE: Multidetector CT imaging of the head and cervical spine was
performed following the standard protocol without intravenous
contrast. Multiplanar CT image reconstructions of the cervical spine
were also generated.

[Series 6: head 3.0 mpr sag · sagittal · 0.34mm/px · 1 of 67 slices shown]
[im 34/67  brain]
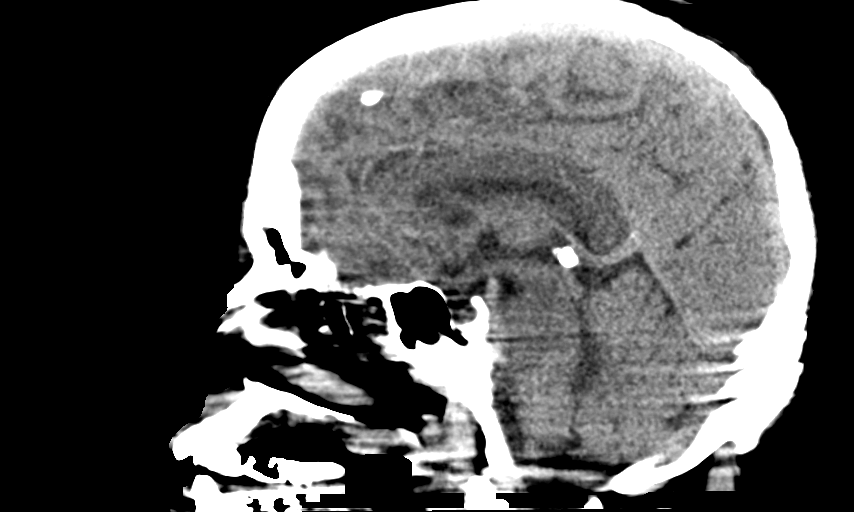

[Series 14: coronal bone · coronal · 0.23mm/px · 3 of 61 slices shown]
[im 13/61  brain]
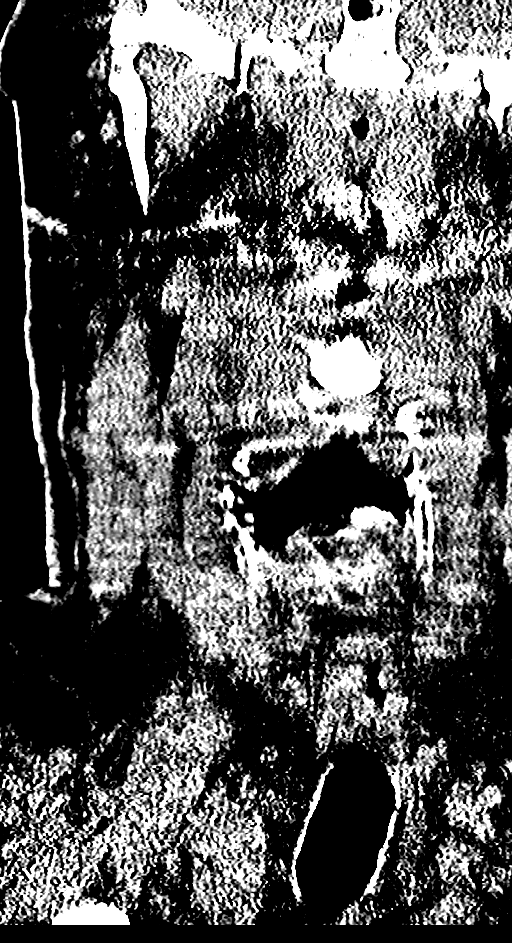
[im 25/61  brain]
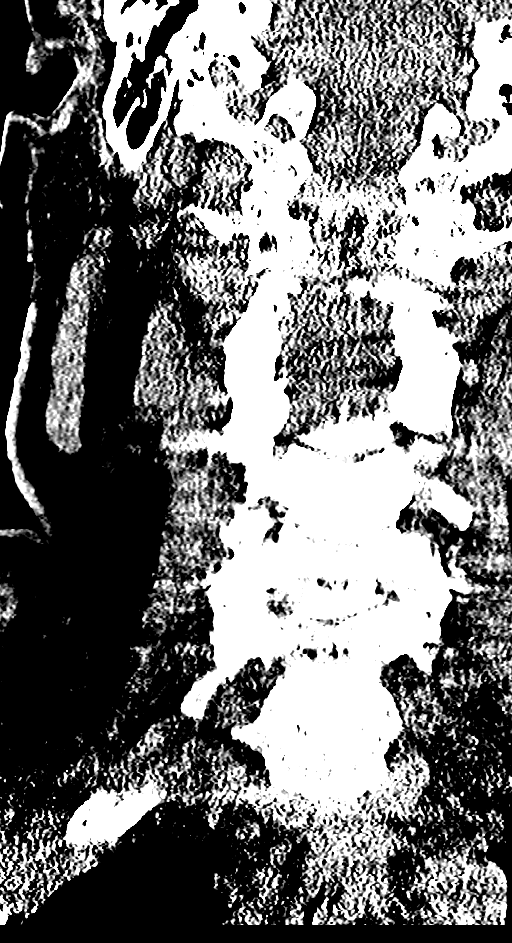
[im 37/61  brain]
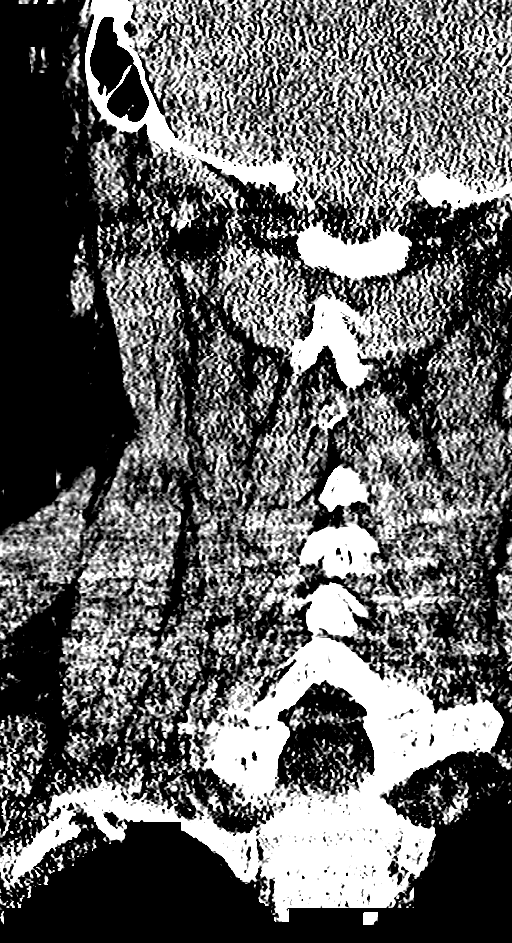

[Series 17: orthogonal axial st · axial · 0.21mm/px · z∈[-296,-170]mm · 5 of 102 slices shown, 7 images (1 of 2)]
[im 17/102  brain]
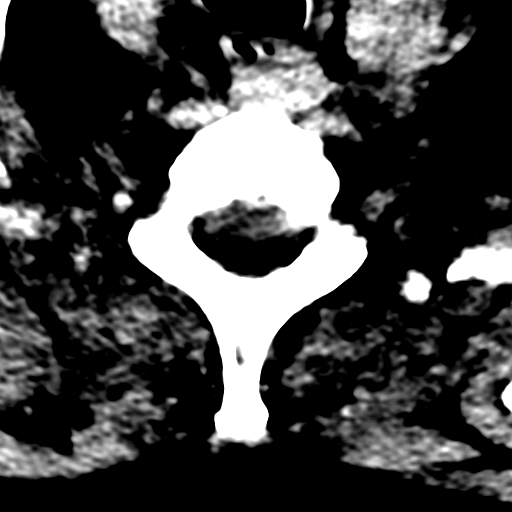
[im 17/102  bone]
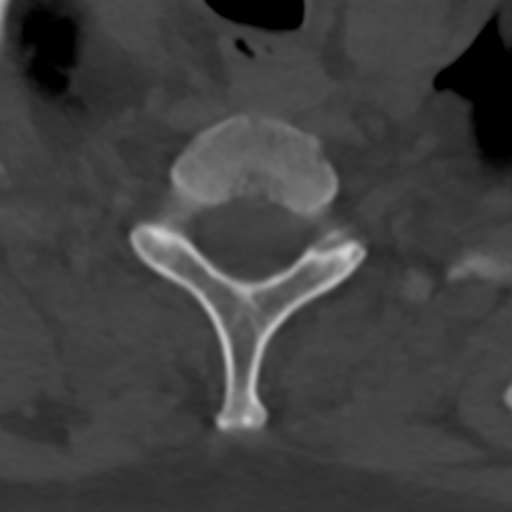
[im 34/102  brain]
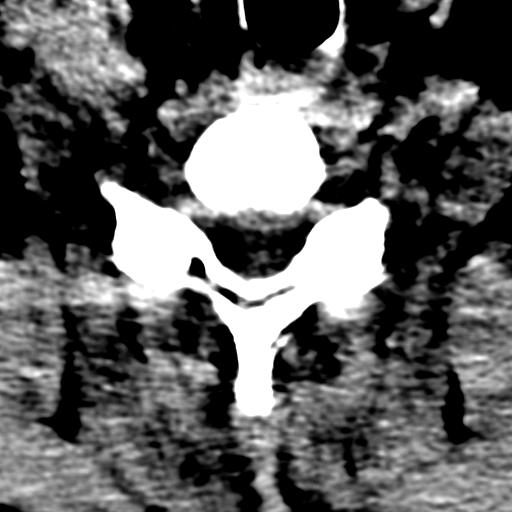
[im 51/102  brain]
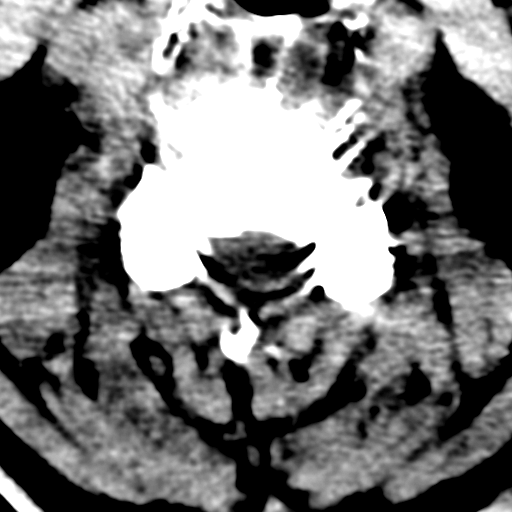
[im 68/102  brain]
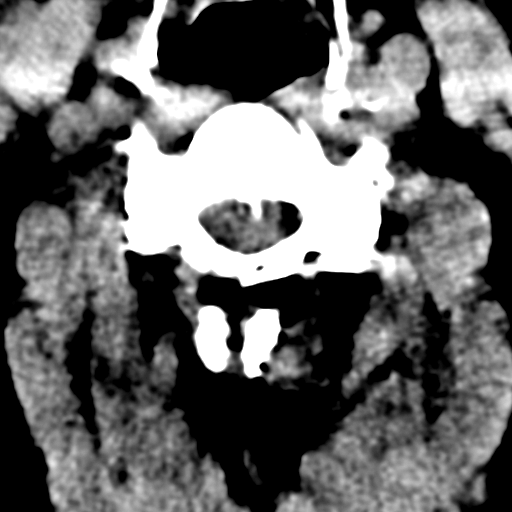
[im 85/102  brain]
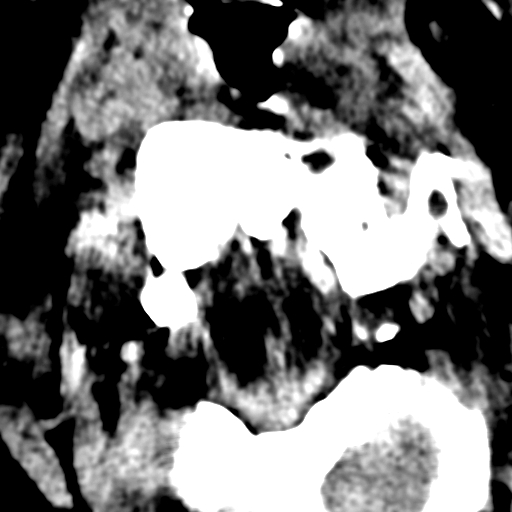
[im 85/102  bone]
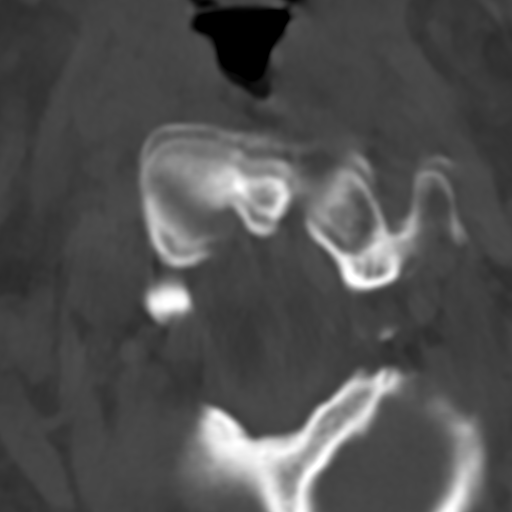

[Series 24: orthogonal axial st · axial · 0.21mm/px · z∈[-262,-158]mm · 4 of 86 slices shown (2 of 2)]
[im 18/86  brain]
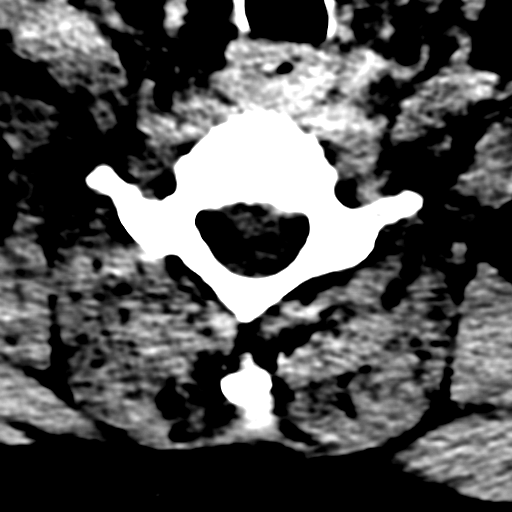
[im 35/86  brain]
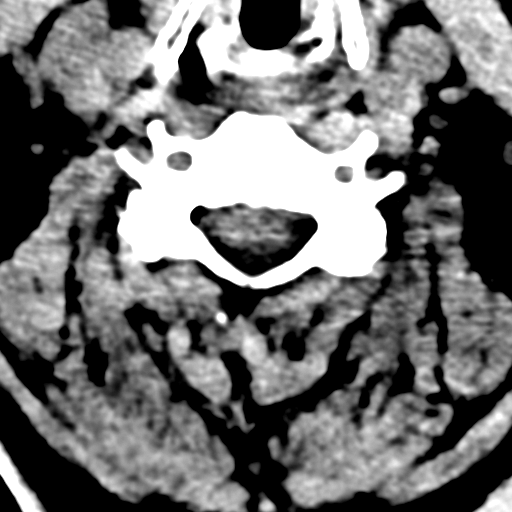
[im 52/86  brain]
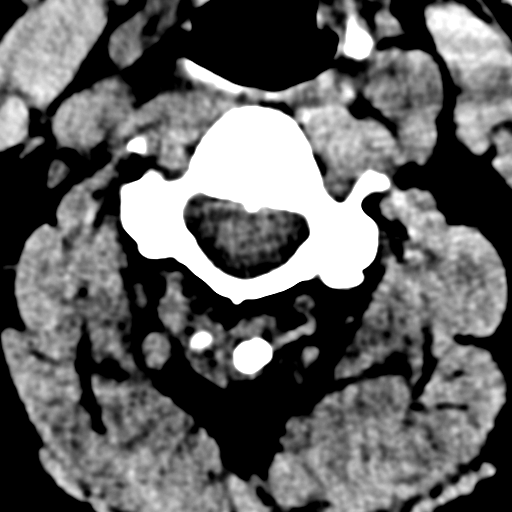
[im 69/86  brain]
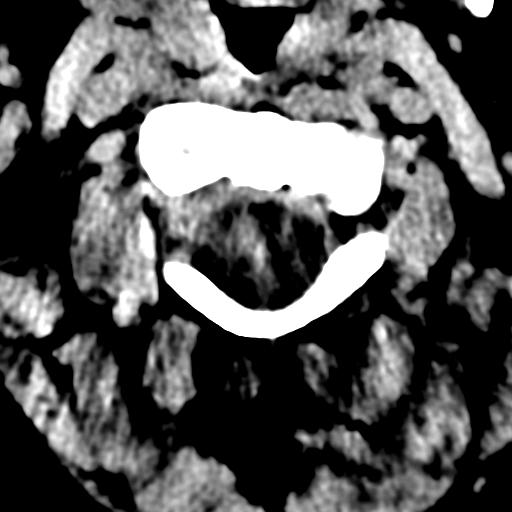

[13 of 47 positions shown; findings below may reference images not displayed]

FINDINGS: CT HEAD FINDINGS

Brain: No acute intracranial abnormality. Specifically, no
hemorrhage, hydrocephalus, mass lesion, acute infarction, or
significant intracranial injury.

Vascular: No hyperdense vessel or unexpected calcification.

Skull: No acute calvarial abnormality.

Sinuses/Orbits: Visualized paranasal sinuses and mastoids clear.
Orbital soft tissues unremarkable.

Other: None

CT CERVICAL SPINE FINDINGS

Alignment: Normal

Skull base and vertebrae: No acute fracture. No primary bone lesion
or focal pathologic process.

Soft tissues and spinal canal: No prevertebral fluid or swelling. No
visible canal hematoma.

Disc levels:  Maintained

Upper chest: Partially demonstrated are right clavicle fracture and
right 2nd rib fracture.

Other: None
IMPRESSION: No acute intracranial abnormality.

No acute bony abnormality in the cervical spine.

Right clavicle and 2nd rib fractures partially imaged.

## 2020-10-15 IMAGING — DX PORTABLE CHEST - 1 VIEW
1 series · 1 of 1 positions shown · non-contrast
Comparison: 02/18/2019

CLINICAL DATA: Respiratory

EXAM:
PORTABLE CHEST 1 VIEW

[chest ap]
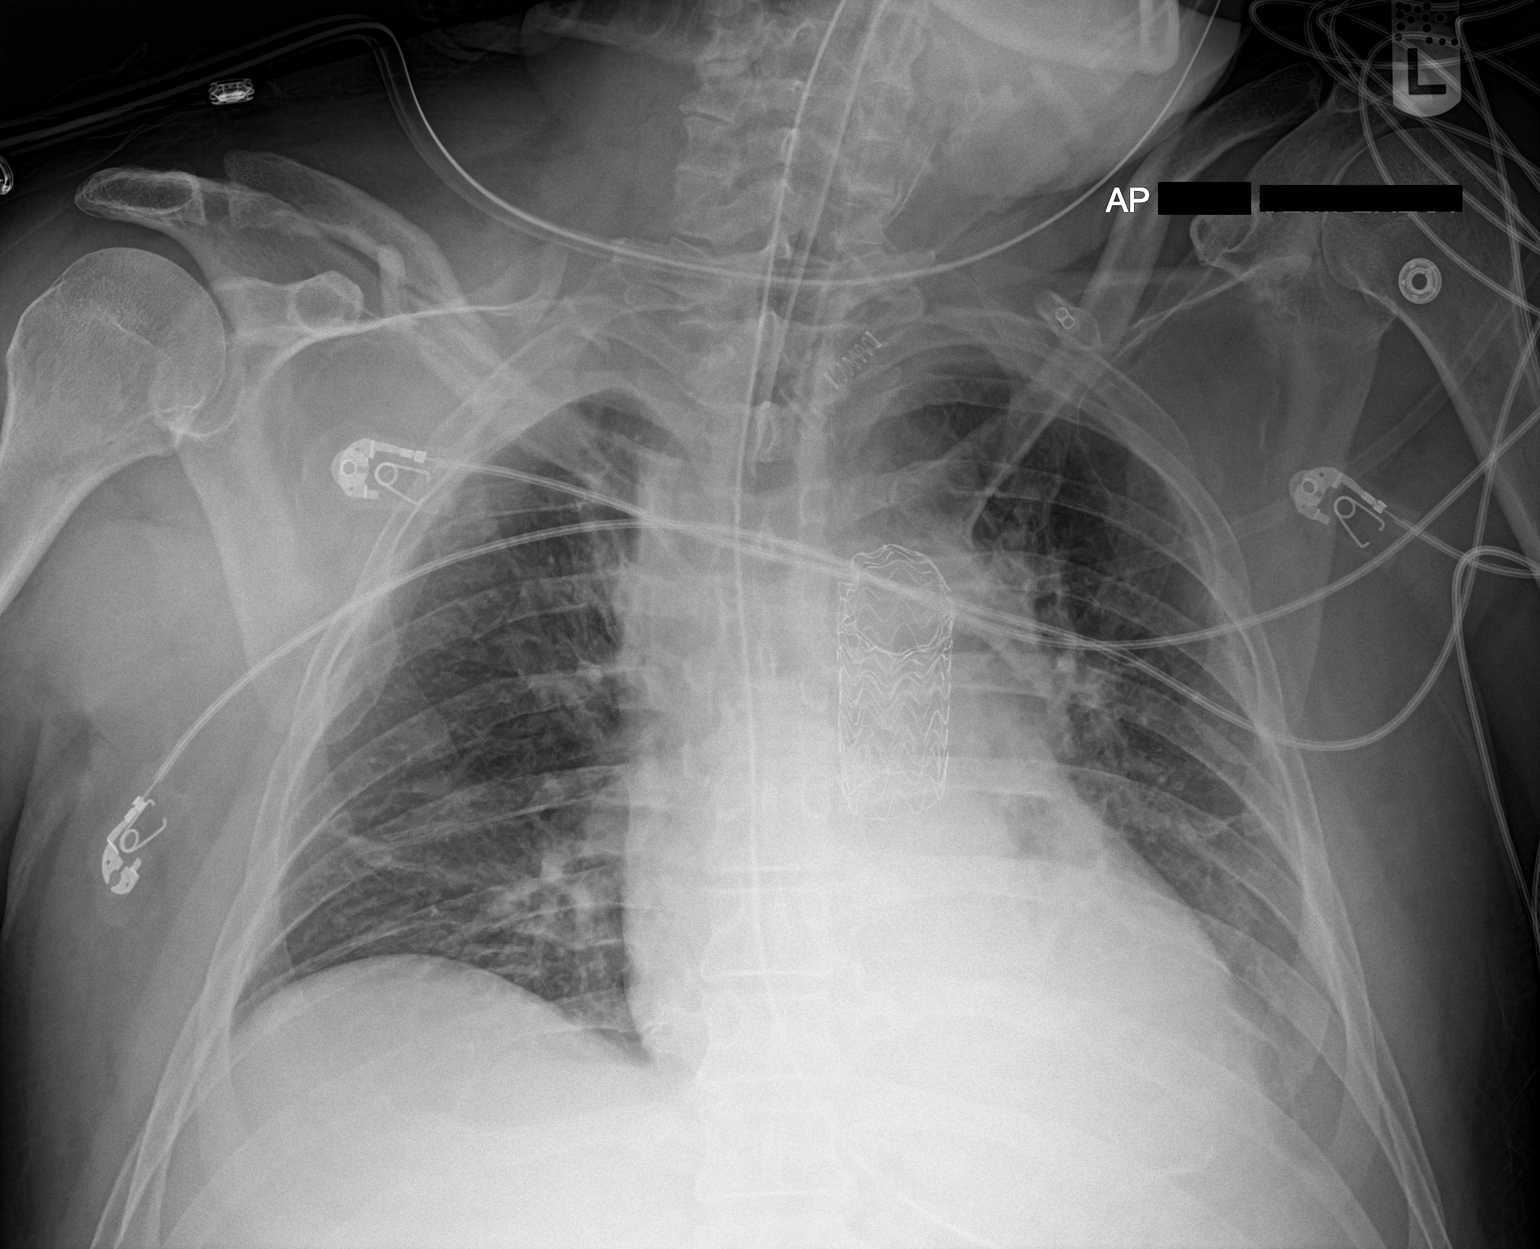

[1 of 1 positions shown; findings below may reference images not displayed]

FINDINGS: Cardiac shadow is stable. Thoracic aortic stent graft is seen.
Endotracheal tube and gastric catheter are noted in satisfactory
position. Lungs are hypoinflated with mild left retrocardiac
atelectasis. Right-sided comminuted clavicular fracture is seen. Rib
fractures are also noted posteriorly on the right involving the
fourth and fifth ribs.
IMPRESSION: Left retrocardiac consolidation

Stable right clavicular and right-sided rib fractures.

No definitive pneumothorax is noted.

## 2020-10-16 IMAGING — DX PORTABLE CHEST - 1 VIEW
1 series · 1 of 1 positions shown · non-contrast
Comparison: 02/18/2019

CLINICAL DATA: Follow-up aortic stent graft placement

EXAM:
PORTABLE CHEST 1 VIEW

[chest ap]
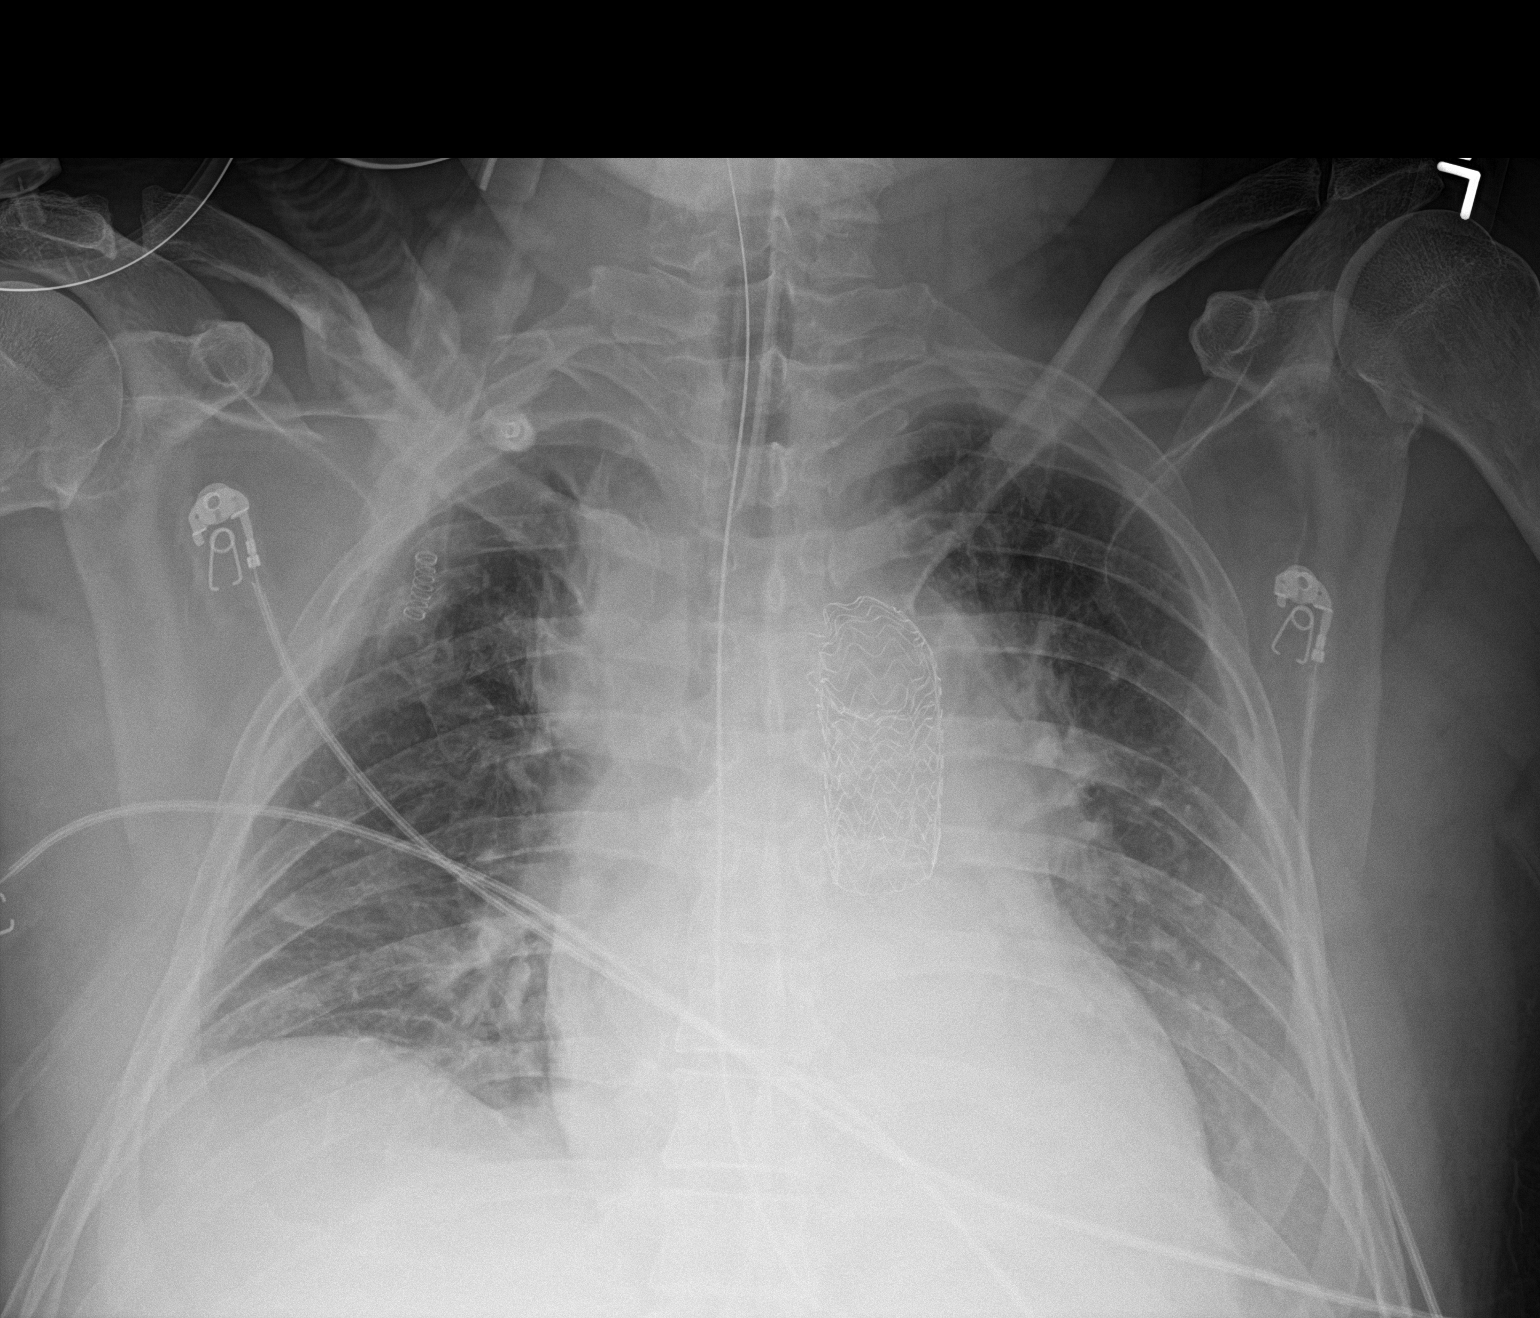

[1 of 1 positions shown; findings below may reference images not displayed]

FINDINGS: Endotracheal tube and gastric catheter are again seen and stable.
Aortic stent graft is again noted and stable. Patient rotation to
the right is noted accentuating the mediastinal markings. The lungs
are well aerated with the exception of left retrocardiac
atelectasis. Right clavicular fracture and multiple right rib
fractures are again seen.
IMPRESSION: Stable left basilar consolidation.

Saturation of the mediastinum related to patient rotation to the
right.

The remainder of the exam is stable.

## 2022-06-15 ENCOUNTER — Telehealth: Payer: Self-pay

## 2022-06-15 ENCOUNTER — Other Ambulatory Visit: Payer: Self-pay

## 2022-06-15 DIAGNOSIS — S2500XA Unspecified injury of thoracic aorta, initial encounter: Secondary | ICD-10-CM

## 2022-06-15 NOTE — Telephone Encounter (Signed)
-----   Message from Nicholaus Corolla sent at 06/15/2022  2:42 PM EDT ----- Regarding: RE: CTA Done  ----- Message ----- From: Kaleen Mask, LPN Sent: 3/81/0175  11:41 AM EDT To: April H Pait; Cathlean Cower; # Subject: CTA                                            Please schedule at [facility] prior to appt on 07/19/22.  Thanks!

## 2022-06-28 ENCOUNTER — Ambulatory Visit (HOSPITAL_COMMUNITY)
Admission: RE | Admit: 2022-06-28 | Discharge: 2022-06-28 | Disposition: A | Discharge status: Home / Self Care | Payer: BC Managed Care – PPO | Source: Ambulatory Visit | Attending: Vascular Surgery | Admitting: Vascular Surgery

## 2022-06-28 DIAGNOSIS — S2500XA Unspecified injury of thoracic aorta, initial encounter: Secondary | ICD-10-CM | POA: Diagnosis present

## 2022-06-28 MED ORDER — IOHEXOL 350 MG/ML SOLN
100.0000 mL | Freq: Once | INTRAVENOUS | Status: AC | PRN
  Administered 2022-06-28: 100 mL via INTRAVENOUS

## 2022-07-19 ENCOUNTER — Ambulatory Visit (INDEPENDENT_AMBULATORY_CARE_PROVIDER_SITE_OTHER): Payer: BC Managed Care – PPO | Admitting: Vascular Surgery

## 2022-07-19 ENCOUNTER — Encounter: Payer: Self-pay | Admitting: Vascular Surgery

## 2022-07-19 VITALS — BP 155/92 | HR 63 | Temp 97.9°F | Resp 18 | Ht 73.0 in | Wt 220.0 lb

## 2022-07-19 DIAGNOSIS — S2500XA Unspecified injury of thoracic aorta, initial encounter: Secondary | ICD-10-CM | POA: Diagnosis not present
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
## 2022-10-17 ENCOUNTER — Encounter: Payer: Self-pay | Admitting: Family Medicine

## 2022-10-17 ENCOUNTER — Ambulatory Visit: Payer: BC Managed Care – PPO | Admitting: Family Medicine

## 2022-10-17 VITALS — BP 136/86 | HR 79 | Temp 97.7°F | Ht 73.5 in | Wt 217.0 lb

## 2022-10-17 DIAGNOSIS — M159 Polyosteoarthritis, unspecified: Secondary | ICD-10-CM | POA: Diagnosis not present

## 2022-10-17 DIAGNOSIS — M151 Heberden's nodes (with arthropathy): Secondary | ICD-10-CM | POA: Diagnosis not present

## 2022-10-17 DIAGNOSIS — L570 Actinic keratosis: Secondary | ICD-10-CM | POA: Diagnosis not present

## 2022-10-17 DIAGNOSIS — I700 Atherosclerosis of aorta: Secondary | ICD-10-CM | POA: Diagnosis not present
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
## 2022-10-17 NOTE — Assessment & Plan Note (Signed)
Seen on previous CTA, most recently 07/2022.  Will get restratification labs, but will likely need to be on a statin.  Discuss at follow-up

## 2022-10-17 NOTE — Assessment & Plan Note (Addendum)
Randall Cherry presents with a longstanding, non-healing lesion on his right temple, likely exacerbated by sun exposure.  Differential diagnosis:  Actinic keratosis  - Given the lesion's chronic nature, sun sensitivity, and scaliness, actinic keratosis, a precancerous lesion caused by sun damage, is likely. Basal cell carcinoma  - As a non-healing lesion on sun-exposed skin, this common form of skin cancer is also possible. Squamous cell carcinoma  - Another form of skin cancer that could present this way, albeit less common than basal cell carcinoma. Plan: Randall Cherry should be referred to dermatology for further evaluation and possible biopsy to confirm the diagnosis. Cryotherapy or topical chemotherapy with 5-fluorouracil may be considered for actinic keratosis, pending dermatology's recommendations. A regular follow-up for skin examination is advised due to the increased risk of skin cancers in areas of sun exposure.

## 2022-10-17 NOTE — Assessment & Plan Note (Signed)
Mild without significant impairment to function.  Continue monitor

## 2022-10-17 NOTE — Patient Instructions (Addendum)
Please follow-up get fasting labs.  We will base follow-up gram this, otherwise we will continue

## 2022-10-18 ENCOUNTER — Other Ambulatory Visit (INDEPENDENT_AMBULATORY_CARE_PROVIDER_SITE_OTHER): Payer: BC Managed Care – PPO

## 2022-10-18 DIAGNOSIS — E782 Mixed hyperlipidemia: Secondary | ICD-10-CM

## 2022-10-18 DIAGNOSIS — I700 Atherosclerosis of aorta: Secondary | ICD-10-CM | POA: Diagnosis not present

## 2022-10-18 LAB — COMPREHENSIVE METABOLIC PANEL
ALT: 19 U/L (ref 0–53)
AST: 16 U/L (ref 0–37)
Albumin: 4.2 g/dL (ref 3.5–5.2)
Alkaline Phosphatase: 66 U/L (ref 39–117)
BUN: 18 mg/dL (ref 6–23)
CO2: 29 mEq/L (ref 19–32)
Calcium: 9.1 mg/dL (ref 8.4–10.5)
Chloride: 103 mEq/L (ref 96–112)
Creatinine, Ser: 0.91 mg/dL (ref 0.40–1.50)
GFR: 89.52 mL/min (ref >60.00)
Glucose, Bld: 107 mg/dL — ABNORMAL HIGH (ref 70–99)
Potassium: 4.2 mEq/L (ref 3.5–5.1)
Sodium: 139 mEq/L (ref 135–145)
Total Bilirubin: 0.5 mg/dL (ref 0.2–1.2)
Total Protein: 6.6 g/dL (ref 6.0–8.3)

## 2022-10-18 LAB — URINALYSIS, ROUTINE W REFLEX MICROSCOPIC
Bilirubin Urine: NEGATIVE
Hgb urine dipstick: NEGATIVE
Ketones, ur: NEGATIVE
Leukocytes,Ua: NEGATIVE
Nitrite: NEGATIVE
RBC / HPF: NONE SEEN (ref 0–?)
Specific Gravity, Urine: 1.025 (ref 1.000–1.030)
Total Protein, Urine: NEGATIVE
Urine Glucose: NEGATIVE
Urobilinogen, UA: 0.2 (ref 0.0–1.0)
pH: 6.5 (ref 5.0–8.0)

## 2022-10-18 LAB — LIPID PANEL
Cholesterol: 156 mg/dL (ref 0–200)
HDL: 38.3 mg/dL — ABNORMAL LOW (ref >39.00)
NonHDL: 117.81
Total CHOL/HDL Ratio: 4
Triglycerides: 225 mg/dL — ABNORMAL HIGH (ref 0.0–149.0)
VLDL: 45 mg/dL — ABNORMAL HIGH (ref 0.0–40.0)

## 2022-10-18 LAB — CBC WITH DIFFERENTIAL/PLATELET
Basophils Absolute: 0.1 10*3/uL (ref 0.0–0.1)
Basophils Relative: 0.8 % (ref 0.0–3.0)
Eosinophils Absolute: 0.1 10*3/uL (ref 0.0–0.7)
Eosinophils Relative: 1.5 % (ref 0.0–5.0)
HCT: 40.6 % (ref 39.0–52.0)
Hemoglobin: 14.4 g/dL (ref 13.0–17.0)
Lymphocytes Relative: 27.8 % (ref 12.0–46.0)
Lymphs Abs: 1.8 10*3/uL (ref 0.7–4.0)
MCHC: 35.5 g/dL (ref 30.0–36.0)
MCV: 96.6 fl (ref 78.0–100.0)
Monocytes Absolute: 0.7 10*3/uL (ref 0.1–1.0)
Monocytes Relative: 10.9 % (ref 3.0–12.0)
Neutro Abs: 3.8 10*3/uL (ref 1.4–7.7)
Neutrophils Relative %: 59 % (ref 43.0–77.0)
Platelets: 277 10*3/uL (ref 150.0–400.0)
RBC: 4.2 Mil/uL — ABNORMAL LOW (ref 4.22–5.81)
RDW: 12.8 % (ref 11.5–15.5)
WBC: 6.5 10*3/uL (ref 4.0–10.5)

## 2022-10-18 LAB — MICROALBUMIN / CREATININE URINE RATIO
Creatinine,U: 198.1 mg/dL
Microalb Creat Ratio: 0.4 mg/g (ref 0.0–30.0)
Microalb, Ur: <0.7 mg/dL (ref 0.0–1.9)

## 2022-10-18 LAB — LDL CHOLESTEROL, DIRECT: Direct LDL: 90 mg/dL

## 2022-10-18 LAB — TSH: TSH: 4.1 u[IU]/mL (ref 0.35–5.50)

## 2022-10-18 LAB — HEMOGLOBIN A1C: Hgb A1c MFr Bld: 5.3 % (ref 4.6–6.5)

## 2022-10-18 MED ORDER — ROSUVASTATIN CALCIUM 20 MG PO TABS: 20.0000 mg | ORAL_TABLET | Freq: Every day | ORAL | 3 refills | Status: DC

## 2022-10-18 NOTE — Addendum Note (Signed)
Addended by: Bonnita Hollow on: 10/18/2022 05:31 PM   Modules accepted: Orders

## 2022-12-09 ENCOUNTER — Other Ambulatory Visit: Payer: BC Managed Care – PPO

## 2023-10-17 ENCOUNTER — Telehealth: Payer: Self-pay | Admitting: Family Medicine

## 2023-10-17 DIAGNOSIS — Z125 Encounter for screening for malignant neoplasm of prostate: Secondary | ICD-10-CM

## 2023-10-17 DIAGNOSIS — E782 Mixed hyperlipidemia: Secondary | ICD-10-CM

## 2023-10-17 DIAGNOSIS — Z114 Encounter for screening for human immunodeficiency virus [HIV]: Secondary | ICD-10-CM

## 2023-10-17 DIAGNOSIS — Z1159 Encounter for screening for other viral diseases: Secondary | ICD-10-CM

## 2023-10-17 DIAGNOSIS — I700 Atherosclerosis of aorta: Secondary | ICD-10-CM

## 2023-10-17 DIAGNOSIS — Z1321 Encounter for screening for nutritional disorder: Secondary | ICD-10-CM

## 2023-10-17 NOTE — Telephone Encounter (Signed)
Copied from CRM [id number]. Topic: Clinical - Request for Lab/Test Order >> Oct 17, 2023  2:25 PM Fredrica W wrote: Reason for CRM: Patient called to reschedule appt for physical because he canceled lab appt. Wanted to have labs prior to physical. Rescheduled both wanted to make sure labs would be ordered before appt. Thank You   Dr. Janee Morn, [facility] scheduled pt for lab visit and cpe later. Office staff called pt earlier today to advise labs are ordered at the time of the visit. Pt called back and told [facility] he wants labs prior to physical. They have scheduled pt for lab visit 10/30/2023 and cpe 11/03/2023 .Please advise if you will accommodate request for pre-visit labs.

## 2023-10-18 NOTE — Telephone Encounter (Signed)
Admin - please call pt to notify lab orders in and he can have prior to CPE.

## 2023-10-18 NOTE — Telephone Encounter (Signed)
Lvm with below message, will call again.

## 2023-10-20 ENCOUNTER — Encounter: Payer: BC Managed Care – PPO | Admitting: Family Medicine

## 2023-10-30 ENCOUNTER — Other Ambulatory Visit (INDEPENDENT_AMBULATORY_CARE_PROVIDER_SITE_OTHER): Payer: BC Managed Care – PPO

## 2023-10-30 DIAGNOSIS — Z125 Encounter for screening for malignant neoplasm of prostate: Secondary | ICD-10-CM

## 2023-10-30 DIAGNOSIS — Z114 Encounter for screening for human immunodeficiency virus [HIV]: Secondary | ICD-10-CM

## 2023-10-30 DIAGNOSIS — E782 Mixed hyperlipidemia: Secondary | ICD-10-CM

## 2023-10-30 DIAGNOSIS — Z1159 Encounter for screening for other viral diseases: Secondary | ICD-10-CM

## 2023-10-30 DIAGNOSIS — Z1329 Encounter for screening for other suspected endocrine disorder: Secondary | ICD-10-CM

## 2023-10-30 DIAGNOSIS — Z130 Encounter for screening for diseases of the blood and blood-forming organs and certain disorders involving the immune mechanism: Secondary | ICD-10-CM

## 2023-10-30 DIAGNOSIS — R3121 Asymptomatic microscopic hematuria: Secondary | ICD-10-CM

## 2023-10-30 DIAGNOSIS — I700 Atherosclerosis of aorta: Secondary | ICD-10-CM

## 2023-10-30 LAB — COMPREHENSIVE METABOLIC PANEL
ALT: 25 U/L (ref 0–53)
AST: 22 U/L (ref 0–37)
Albumin: 4.3 g/dL (ref 3.5–5.2)
Alkaline Phosphatase: 63 U/L (ref 39–117)
BUN: 13 mg/dL (ref 6–23)
CO2: 27 meq/L (ref 19–32)
Calcium: 9.2 mg/dL (ref 8.4–10.5)
Chloride: 103 meq/L (ref 96–112)
Creatinine, Ser: 0.78 mg/dL (ref 0.40–1.50)
GFR: 94.04 mL/min (ref >60.00)
Glucose, Bld: 104 mg/dL — ABNORMAL HIGH (ref 70–99)
Potassium: 3.8 meq/L (ref 3.5–5.1)
Sodium: 139 meq/L (ref 135–145)
Total Bilirubin: 1.1 mg/dL (ref 0.2–1.2)
Total Protein: 7.1 g/dL (ref 6.0–8.3)

## 2023-10-30 LAB — LIPID PANEL
Cholesterol: 97 mg/dL (ref 0–200)
HDL: 47.5 mg/dL (ref >39.00)
LDL Cholesterol: 38 mg/dL (ref 0–99)
NonHDL: 49.22
Total CHOL/HDL Ratio: 2
Triglycerides: 54 mg/dL (ref 0.0–149.0)
VLDL: 10.8 mg/dL (ref 0.0–40.0)

## 2023-10-30 LAB — CBC WITH DIFFERENTIAL/PLATELET
Basophils Absolute: 0 10*3/uL (ref 0.0–0.1)
Basophils Relative: 0.9 % (ref 0.0–3.0)
Eosinophils Absolute: 0.1 10*3/uL (ref 0.0–0.7)
Eosinophils Relative: 1.6 % (ref 0.0–5.0)
HCT: 43.4 % (ref 39.0–52.0)
Hemoglobin: 15.2 g/dL (ref 13.0–17.0)
Lymphocytes Relative: 24.3 % (ref 12.0–46.0)
Lymphs Abs: 1.3 10*3/uL (ref 0.7–4.0)
MCHC: 35.1 g/dL (ref 30.0–36.0)
MCV: 96.6 fL (ref 78.0–100.0)
Monocytes Absolute: 0.6 10*3/uL (ref 0.1–1.0)
Monocytes Relative: 10.9 % (ref 3.0–12.0)
Neutro Abs: 3.2 10*3/uL (ref 1.4–7.7)
Neutrophils Relative %: 62.3 % (ref 43.0–77.0)
Platelets: 241 10*3/uL (ref 150.0–400.0)
RBC: 4.49 Mil/uL (ref 4.22–5.81)
RDW: 13.2 % (ref 11.5–15.5)
WBC: 5.2 10*3/uL (ref 4.0–10.5)

## 2023-10-30 LAB — URINALYSIS, ROUTINE W REFLEX MICROSCOPIC
Bilirubin Urine: NEGATIVE
Ketones, ur: NEGATIVE
Leukocytes,Ua: NEGATIVE
Nitrite: NEGATIVE
Specific Gravity, Urine: 1.02 (ref 1.000–1.030)
Total Protein, Urine: NEGATIVE
Urine Glucose: NEGATIVE
Urobilinogen, UA: 0.2 (ref 0.0–1.0)
pH: 6 (ref 5.0–8.0)

## 2023-10-30 LAB — MICROALBUMIN / CREATININE URINE RATIO
Creatinine,U: 149.8 mg/dL
Microalb Creat Ratio: 10.5 mg/g (ref 0.0–30.0)
Microalb, Ur: 1.6 mg/dL (ref 0.0–1.9)

## 2023-10-30 LAB — HEMOGLOBIN A1C: Hgb A1c MFr Bld: 5.3 % (ref 4.6–6.5)

## 2023-10-30 LAB — TSH: TSH: 2.36 u[IU]/mL (ref 0.35–5.50)

## 2023-10-31 LAB — URINALYSIS W MICROSCOPIC + REFLEX CULTURE
Bacteria, UA: NONE SEEN /[HPF]
Bilirubin Urine: NEGATIVE
Glucose, UA: NEGATIVE
Hgb urine dipstick: NEGATIVE
Hyaline Cast: NONE SEEN /[LPF]
Ketones, ur: NEGATIVE
Leukocyte Esterase: NEGATIVE
Nitrites, Initial: NEGATIVE
Protein, ur: NEGATIVE
RBC / HPF: NONE SEEN /[HPF] (ref 0–2)
Specific Gravity, Urine: 1.019 (ref 1.001–1.035)
Squamous Epithelial / HPF: NONE SEEN /[HPF] (ref <=5)
WBC, UA: NONE SEEN /[HPF] (ref 0–5)
pH: 6 (ref 5.0–8.0)

## 2023-10-31 LAB — NO CULTURE INDICATED

## 2023-11-02 ENCOUNTER — Encounter: Payer: Self-pay | Admitting: Family Medicine

## 2023-11-02 NOTE — Addendum Note (Signed)
Addended by: Garnette Gunner on: 11/02/2023 08:52 AM   Modules accepted: Orders

## 2023-11-03 ENCOUNTER — Ambulatory Visit (INDEPENDENT_AMBULATORY_CARE_PROVIDER_SITE_OTHER): Payer: BC Managed Care – PPO | Admitting: Family Medicine

## 2023-11-03 VITALS — BP 130/74 | HR 70 | Temp 97.2°F | Wt 211.8 lb

## 2023-11-03 DIAGNOSIS — Z87828 Personal history of other (healed) physical injury and trauma: Secondary | ICD-10-CM

## 2023-11-03 DIAGNOSIS — Z1211 Encounter for screening for malignant neoplasm of colon: Secondary | ICD-10-CM

## 2023-11-03 DIAGNOSIS — Z23 Encounter for immunization: Secondary | ICD-10-CM

## 2023-11-03 DIAGNOSIS — Z0000 Encounter for general adult medical examination without abnormal findings: Secondary | ICD-10-CM | POA: Diagnosis not present

## 2023-11-03 DIAGNOSIS — R3121 Asymptomatic microscopic hematuria: Secondary | ICD-10-CM | POA: Diagnosis not present

## 2023-11-03 DIAGNOSIS — I700 Atherosclerosis of aorta: Secondary | ICD-10-CM | POA: Diagnosis not present

## 2023-11-03 DIAGNOSIS — E782 Mixed hyperlipidemia: Secondary | ICD-10-CM

## 2023-11-03 DIAGNOSIS — S2500XA Unspecified injury of thoracic aorta, initial encounter: Secondary | ICD-10-CM
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
## 2023-11-04 LAB — HIV ANTIBODY (ROUTINE TESTING W REFLEX): HIV 1&2 Ab, 4th Generation: NONREACTIVE

## 2023-11-04 LAB — URINALYSIS W MICROSCOPIC + REFLEX CULTURE
Bacteria, UA: NONE SEEN /[HPF]
Bilirubin Urine: NEGATIVE
Glucose, UA: NEGATIVE
Hgb urine dipstick: NEGATIVE
Hyaline Cast: NONE SEEN /[LPF]
Ketones, ur: NEGATIVE
Leukocyte Esterase: NEGATIVE
Nitrites, Initial: NEGATIVE
Protein, ur: NEGATIVE
RBC / HPF: NONE SEEN /[HPF] (ref 0–2)
Specific Gravity, Urine: 1.003 (ref 1.001–1.035)
Squamous Epithelial / HPF: NONE SEEN /[HPF] (ref <=5)
WBC, UA: NONE SEEN /[HPF] (ref 0–5)
pH: 6.5 (ref 5.0–8.0)

## 2023-11-04 LAB — VITAMIN D 1,25 DIHYDROXY
Vitamin D 1, 25 (OH)2 Total: 48 pg/mL (ref 18–72)
Vitamin D2 1, 25 (OH)2: <8 pg/mL
Vitamin D3 1, 25 (OH)2: 48 pg/mL

## 2023-11-04 LAB — HEPATITIS C ANTIBODY: Hepatitis C Ab: NONREACTIVE

## 2023-11-04 LAB — PSA, TOTAL AND FREE
PSA, % Free: 29 % (ref >25)
PSA, Free: 0.2 ng/mL
PSA, Total: 0.7 ng/mL (ref <=4.0)

## 2023-11-04 LAB — NO CULTURE INDICATED

## 2023-11-06 ENCOUNTER — Encounter: Payer: Self-pay | Admitting: Family Medicine

## 2023-11-07 ENCOUNTER — Encounter: Payer: Self-pay | Admitting: Family Medicine

## 2023-11-07 DIAGNOSIS — E782 Mixed hyperlipidemia: Secondary | ICD-10-CM

## 2023-11-07 DIAGNOSIS — R3121 Asymptomatic microscopic hematuria: Secondary | ICD-10-CM | POA: Insufficient documentation

## 2023-11-07 HISTORY — DX: Mixed hyperlipidemia: E78.2

## 2023-12-11 ENCOUNTER — Encounter: Payer: Self-pay | Admitting: Urology

## 2023-12-11 ENCOUNTER — Ambulatory Visit: Payer: PRIVATE HEALTH INSURANCE | Admitting: Urology

## 2023-12-11 VITALS — BP 164/85 | HR 80 | Ht 73.0 in | Wt 210.0 lb

## 2023-12-11 DIAGNOSIS — R3129 Other microscopic hematuria: Secondary | ICD-10-CM

## 2023-12-11 DIAGNOSIS — Z8042 Family history of malignant neoplasm of prostate: Secondary | ICD-10-CM | POA: Insufficient documentation

## 2023-12-11 LAB — URINALYSIS, ROUTINE W REFLEX MICROSCOPIC
Bilirubin, UA: NEGATIVE
Glucose, UA: NEGATIVE
Ketones, UA: NEGATIVE
Leukocytes,UA: NEGATIVE
Nitrite, UA: NEGATIVE
Protein,UA: NEGATIVE
RBC, UA: NEGATIVE
Specific Gravity, UA: 1.01 (ref 1.005–1.030)
Urobilinogen, Ur: 0.2 mg/dL (ref 0.2–1.0)
pH, UA: 6.5 (ref 5.0–7.5)
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
## 2024-01-09 ENCOUNTER — Other Ambulatory Visit: Payer: Self-pay

## 2024-01-09 DIAGNOSIS — R3129 Other microscopic hematuria: Secondary | ICD-10-CM

## 2024-01-11 ENCOUNTER — Other Ambulatory Visit: Payer: Self-pay

## 2024-01-11 ENCOUNTER — Encounter: Payer: Self-pay | Admitting: Urology

## 2024-01-11 DIAGNOSIS — R3129 Other microscopic hematuria: Secondary | ICD-10-CM

## 2024-01-11 LAB — URINALYSIS, ROUTINE W REFLEX MICROSCOPIC
Bilirubin, UA: NEGATIVE
Glucose, UA: NEGATIVE
Ketones, UA: NEGATIVE
Leukocytes,UA: NEGATIVE
Nitrite, UA: NEGATIVE
Protein,UA: NEGATIVE
Specific Gravity, UA: 1.01 (ref 1.005–1.030)
Urobilinogen, Ur: 0.2 mg/dL (ref 0.2–1.0)
pH, UA: 6 (ref 5.0–7.5)

## 2024-01-11 LAB — MICROSCOPIC EXAMINATION

## 2024-03-09 ENCOUNTER — Other Ambulatory Visit: Payer: Self-pay | Admitting: Family Medicine

## 2024-03-09 DIAGNOSIS — E782 Mixed hyperlipidemia: Secondary | ICD-10-CM

## 2024-03-09 DIAGNOSIS — I700 Atherosclerosis of aorta: Secondary | ICD-10-CM

## 2024-03-15 ENCOUNTER — Encounter: Payer: Self-pay | Admitting: Family Medicine

## 2024-03-27 ENCOUNTER — Ambulatory Visit

## 2024-09-16 ENCOUNTER — Other Ambulatory Visit: Payer: Self-pay

## 2024-09-16 DIAGNOSIS — S2500XD Unspecified injury of thoracic aorta, subsequent encounter: Secondary | ICD-10-CM

## 2024-09-17 NOTE — Addendum Note (Signed)
 Addended by: RAYNA MOATS A on: 09/17/2024 11:37 AM   Modules accepted: Orders

## 2024-10-22 ENCOUNTER — Ambulatory Visit: Admitting: Vascular Surgery

## 2024-11-04 ENCOUNTER — Encounter: Payer: BC Managed Care – PPO | Admitting: Family Medicine

## 2024-11-05 ENCOUNTER — Encounter: Admitting: Family Medicine

## 2024-12-03 ENCOUNTER — Ambulatory Visit: Admitting: Vascular Surgery
